# Patient Record
Sex: Female | Born: 1970 | Race: Black or African American | Hispanic: No | Marital: Single | Smoking: Former smoker
Health system: Southern US, Community
[De-identification: ages and names within clinical notes are randomized; demographics above are authoritative.]

## PROBLEM LIST (undated history)

## (undated) DIAGNOSIS — E049 Nontoxic goiter, unspecified: Secondary | ICD-10-CM

## (undated) DIAGNOSIS — Z87448 Personal history of other diseases of urinary system: Secondary | ICD-10-CM

## (undated) DIAGNOSIS — E0590 Thyrotoxicosis, unspecified without thyrotoxic crisis or storm: Secondary | ICD-10-CM

## (undated) DIAGNOSIS — Z789 Other specified health status: Secondary | ICD-10-CM

## (undated) DIAGNOSIS — I10 Essential (primary) hypertension: Secondary | ICD-10-CM

## (undated) DIAGNOSIS — T7840XA Allergy, unspecified, initial encounter: Secondary | ICD-10-CM

## (undated) DIAGNOSIS — M1990 Unspecified osteoarthritis, unspecified site: Secondary | ICD-10-CM

## (undated) DIAGNOSIS — R222 Localized swelling, mass and lump, trunk: Secondary | ICD-10-CM

## (undated) DIAGNOSIS — Z8489 Family history of other specified conditions: Secondary | ICD-10-CM

## (undated) DIAGNOSIS — Z87898 Personal history of other specified conditions: Secondary | ICD-10-CM

## (undated) DIAGNOSIS — R042 Hemoptysis: Secondary | ICD-10-CM

## (undated) HISTORY — PX: WISDOM TOOTH EXTRACTION: SHX21

## (undated) HISTORY — DX: Allergy, unspecified, initial encounter: T78.40XA

## (undated) HISTORY — DX: Unspecified osteoarthritis, unspecified site: M19.90

## (undated) HISTORY — PX: TUBAL LIGATION: SHX77

---

## 1998-09-15 ENCOUNTER — Emergency Department (HOSPITAL_COMMUNITY): Admission: EM | Admit: 1998-09-15 | Discharge: 1998-09-15 | Payer: Self-pay | Admitting: Emergency Medicine

## 1999-02-27 ENCOUNTER — Emergency Department (HOSPITAL_COMMUNITY): Admission: EM | Admit: 1999-02-27 | Discharge: 1999-02-27 | Payer: Self-pay | Admitting: Internal Medicine

## 1999-08-17 ENCOUNTER — Encounter: Payer: Self-pay | Admitting: Pediatrics

## 1999-08-17 ENCOUNTER — Ambulatory Visit: Admission: RE | Admit: 1999-08-17 | Discharge: 1999-08-17 | Payer: Self-pay | Admitting: Internal Medicine

## 1999-10-26 ENCOUNTER — Other Ambulatory Visit: Admission: RE | Admit: 1999-10-26 | Discharge: 1999-10-26 | Payer: Self-pay | Admitting: Family Medicine

## 2000-12-21 ENCOUNTER — Emergency Department (HOSPITAL_COMMUNITY): Admission: EM | Admit: 2000-12-21 | Discharge: 2000-12-21 | Payer: Self-pay | Admitting: Emergency Medicine

## 2004-05-08 ENCOUNTER — Emergency Department (HOSPITAL_COMMUNITY): Admission: EM | Admit: 2004-05-08 | Discharge: 2004-05-08 | Payer: Self-pay | Admitting: Emergency Medicine

## 2005-08-07 ENCOUNTER — Emergency Department (HOSPITAL_COMMUNITY): Admission: EM | Admit: 2005-08-07 | Discharge: 2005-08-07 | Payer: Self-pay | Admitting: Emergency Medicine

## 2005-08-16 ENCOUNTER — Emergency Department (HOSPITAL_COMMUNITY): Admission: EM | Admit: 2005-08-16 | Discharge: 2005-08-16 | Payer: Self-pay | Admitting: Emergency Medicine

## 2006-11-01 ENCOUNTER — Emergency Department (HOSPITAL_COMMUNITY): Admission: EM | Admit: 2006-11-01 | Discharge: 2006-11-01 | Payer: Self-pay | Admitting: Emergency Medicine

## 2007-07-27 ENCOUNTER — Emergency Department (HOSPITAL_COMMUNITY): Admission: EM | Admit: 2007-07-27 | Discharge: 2007-07-27 | Payer: Self-pay | Admitting: Emergency Medicine

## 2008-06-04 ENCOUNTER — Emergency Department (HOSPITAL_COMMUNITY): Admission: EM | Admit: 2008-06-04 | Discharge: 2008-06-05 | Payer: Self-pay | Admitting: Emergency Medicine

## 2009-12-11 ENCOUNTER — Emergency Department (HOSPITAL_COMMUNITY): Admission: EM | Admit: 2009-12-11 | Discharge: 2009-12-11 | Payer: Self-pay | Admitting: Emergency Medicine

## 2010-04-20 LAB — WET PREP, GENITAL: Yeast Wet Prep HPF POC: NONE SEEN

## 2010-04-20 LAB — URINALYSIS, ROUTINE W REFLEX MICROSCOPIC
Bilirubin Urine: NEGATIVE
Glucose, UA: NEGATIVE mg/dL
Hgb urine dipstick: NEGATIVE
Ketones, ur: NEGATIVE mg/dL
Nitrite: NEGATIVE
Protein, ur: NEGATIVE mg/dL
Specific Gravity, Urine: 1.02 (ref 1.005–1.030)
Urobilinogen, UA: 4 mg/dL — ABNORMAL HIGH (ref 0.0–1.0)
pH: 7 (ref 5.0–8.0)

## 2010-04-20 LAB — POCT PREGNANCY, URINE: Preg Test, Ur: NEGATIVE

## 2010-04-20 LAB — URINE MICROSCOPIC-ADD ON

## 2010-11-18 LAB — BASIC METABOLIC PANEL
BUN: 12
CO2: 29
Chloride: 102
Creatinine, Ser: 0.61
Glucose, Bld: 109 — ABNORMAL HIGH
Potassium: 4.2

## 2010-11-18 LAB — URINALYSIS, ROUTINE W REFLEX MICROSCOPIC
Bilirubin Urine: NEGATIVE
Ketones, ur: NEGATIVE
Nitrite: NEGATIVE
Urobilinogen, UA: 0.2
pH: 6

## 2010-11-18 LAB — PREGNANCY, URINE: Preg Test, Ur: NEGATIVE

## 2011-09-19 ENCOUNTER — Emergency Department (HOSPITAL_COMMUNITY)
Admission: EM | Admit: 2011-09-19 | Discharge: 2011-09-19 | Disposition: A | Discharge status: Home / Self Care | Payer: Self-pay | Attending: Emergency Medicine | Admitting: Emergency Medicine

## 2011-09-19 ENCOUNTER — Encounter (HOSPITAL_COMMUNITY): Payer: Self-pay

## 2011-09-19 ENCOUNTER — Emergency Department (HOSPITAL_COMMUNITY): Payer: Self-pay

## 2011-09-19 DIAGNOSIS — R05 Cough: Secondary | ICD-10-CM | POA: Insufficient documentation

## 2011-09-19 DIAGNOSIS — Z87891 Personal history of nicotine dependence: Secondary | ICD-10-CM | POA: Insufficient documentation

## 2011-09-19 DIAGNOSIS — J209 Acute bronchitis, unspecified: Secondary | ICD-10-CM

## 2011-09-19 DIAGNOSIS — R059 Cough, unspecified: Secondary | ICD-10-CM | POA: Insufficient documentation

## 2011-09-19 DIAGNOSIS — R109 Unspecified abdominal pain: Secondary | ICD-10-CM | POA: Insufficient documentation

## 2011-09-19 MED ORDER — ALBUTEROL SULFATE HFA 108 (90 BASE) MCG/ACT IN AERS
2.0000 | INHALATION_SPRAY | Freq: Once | RESPIRATORY_TRACT | Status: AC
  Administered 2011-09-19: 2 via RESPIRATORY_TRACT
  Filled 2011-09-19: qty 6.7

## 2011-09-19 MED ORDER — AZITHROMYCIN 250 MG PO TABS: 250.0000 mg | ORAL_TABLET | Freq: Every day | ORAL | Status: DC

## 2011-09-19 MED ORDER — ALBUTEROL SULFATE (5 MG/ML) 0.5% IN NEBU
5.0000 mg | INHALATION_SOLUTION | Freq: Once | RESPIRATORY_TRACT | Status: AC
  Administered 2011-09-19: 5 mg via RESPIRATORY_TRACT
  Filled 2011-09-19: qty 1

## 2011-09-19 MED ORDER — PREDNISONE 10 MG PO TABS: 20.0000 mg | ORAL_TABLET | Freq: Two times a day (BID) | ORAL | Status: DC

## 2011-09-19 NOTE — ED Notes (Signed)
Pt sts she got off work, and started to cough and had side pain, sts hemoptysis.

## 2011-09-19 NOTE — ED Notes (Signed)
Awaiting completion of nebulizer treatment before discharging patient. 

## 2011-09-19 NOTE — ED Notes (Signed)
The patient is AOx4 and comfortable with her discharge instructions. 

## 2011-09-19 NOTE — ED Provider Notes (Signed)
History     CSN: [id number]  Arrival date & time 09/19/11  1638   First MD Initiated Contact with Patient 09/19/11 2056      Chief Complaint  Patient presents with  . Cough  . Flank Pain    (Consider location/radiation/quality/duration/timing/severity/associated sxs/prior treatment) HPI Comments: Patient is a 2-pack/day smoker who recently quit 3 months ago.  She reports to me she has had a cough for the past 5 years.  The past two days her cough has worsened and is productive of a bloody sputum.  She denies fevers or chills.  She denies chest pain.    Patient is a 41 y.o. female presenting with cough. The history is provided by the patient.  Cough This is a new problem. The current episode started 2 days ago. The problem has been gradually worsening. The cough is productive of bloody sputum. There has been no fever. Pertinent negatives include no chills and no wheezing. She has tried nothing for the symptoms. Her past medical history does not include pneumonia or emphysema.    History reviewed. No pertinent past medical history.  No past surgical history on file.  No family history on file.  History  Substance Use Topics  . Smoking status: Former Games developer  . Smokeless tobacco: Not on file  . Alcohol Use: No    OB History    Grav Para Term Preterm Abortions TAB SAB Ect Mult Living                  Review of Systems  Constitutional: Negative for chills.  Respiratory: Positive for cough. Negative for wheezing.   All other systems reviewed and are negative.    Allergies  Review of patient's allergies indicates no known allergies.  Home Medications   Current Outpatient Rx  Name Route Sig Dispense Refill  . IBUPROFEN 200 MG PO TABS Oral Take 200 mg by mouth every 6 (six) hours as needed. For pain      BP 117/68  Pulse 82  Temp 98.4 F (36.9 C) (Oral)  Resp 16  SpO2 100%  LMP 09/17/2011  Physical Exam  Nursing note and vitals reviewed. Constitutional: She  is oriented to person, place, and time. She appears well-developed and well-nourished. No distress.  HENT:  Head: Normocephalic and atraumatic.  Mouth/Throat: Oropharynx is clear and moist.  Neck: Normal range of motion. Neck supple.  Cardiovascular: Normal rate and regular rhythm.   No murmur heard. Pulmonary/Chest: Effort normal and breath sounds normal. No respiratory distress. She has no wheezes.  Abdominal: Soft. Bowel sounds are normal. She exhibits no distension. There is no tenderness.  Musculoskeletal: Normal range of motion. She exhibits no edema.  Neurological: She is alert and oriented to person, place, and time.  Skin: Skin is warm and dry. She is not diaphoretic.    ED Course  Procedures (including critical care time)  Labs Reviewed - No data to display Dg Chest 2 View  09/19/2011  *RADIOLOGY REPORT*  Clinical Data: Cough.  Spitting up blood.  CHEST - 2 VIEW  Comparison: PA and lateral chest 11/01/2006.  Findings: The lungs are clear.  Heart size is normal.  No pneumothorax or effusion.  No focal bony abnormality.  IMPRESSION: Negative chest.  Original Report Authenticated By: Bernadene Bell. Angel Kemp, M.D.     No diagnosis found.    MDM  Appears to be bronchitis.  Will treat with zmax, mdi, prednisone.  To return prn if worsens.  Geoffery Lyons, MD 09/19/11 2112

## 2011-09-21 ENCOUNTER — Emergency Department (HOSPITAL_COMMUNITY): Payer: Self-pay

## 2011-09-21 ENCOUNTER — Inpatient Hospital Stay (HOSPITAL_COMMUNITY)
Admission: EM | Admit: 2011-09-21 | Discharge: 2011-09-23 | DRG: 204 | Disposition: A | Discharge status: Home / Self Care | Payer: Self-pay | Attending: Internal Medicine | Admitting: Internal Medicine

## 2011-09-21 ENCOUNTER — Encounter (HOSPITAL_COMMUNITY): Payer: Self-pay | Admitting: *Deleted

## 2011-09-21 DIAGNOSIS — R222 Localized swelling, mass and lump, trunk: Secondary | ICD-10-CM

## 2011-09-21 DIAGNOSIS — R042 Hemoptysis: Principal | ICD-10-CM | POA: Diagnosis present

## 2011-09-21 DIAGNOSIS — E041 Nontoxic single thyroid nodule: Secondary | ICD-10-CM | POA: Diagnosis present

## 2011-09-21 DIAGNOSIS — I498 Other specified cardiac arrhythmias: Secondary | ICD-10-CM | POA: Diagnosis present

## 2011-09-21 DIAGNOSIS — E0590 Thyrotoxicosis, unspecified without thyrotoxic crisis or storm: Secondary | ICD-10-CM | POA: Diagnosis present

## 2011-09-21 DIAGNOSIS — Z87891 Personal history of nicotine dependence: Secondary | ICD-10-CM

## 2011-09-21 DIAGNOSIS — J9859 Other diseases of mediastinum, not elsewhere classified: Secondary | ICD-10-CM | POA: Diagnosis present

## 2011-09-21 HISTORY — DX: Localized swelling, mass and lump, trunk: R22.2

## 2011-09-21 HISTORY — DX: Other specified health status: Z78.9

## 2011-09-21 HISTORY — DX: Family history of other specified conditions: Z84.89

## 2011-09-21 HISTORY — DX: Hemoptysis: R04.2

## 2011-09-21 LAB — CBC WITH DIFFERENTIAL/PLATELET
Basophils Absolute: 0 10*3/uL (ref 0.0–0.1)
Eosinophils Absolute: 0.2 10*3/uL (ref 0.0–0.7)
Eosinophils Relative: 2 % (ref 0–5)
HCT: 41 % (ref 36.0–46.0)
Hemoglobin: 14.2 g/dL (ref 12.0–15.0)
Lymphocytes Relative: 38 % (ref 12–46)
Lymphs Abs: 4.5 10*3/uL — ABNORMAL HIGH (ref 0.7–4.0)
MCH: 30.1 pg (ref 26.0–34.0)
MCH: 30.1 pg (ref 26.0–34.0)
MCHC: 34.6 g/dL (ref 30.0–36.0)
MCV: 86.9 fL (ref 78.0–100.0)
Neutrophils Relative %: 53 % (ref 43–77)
Platelets: 260 10*3/uL (ref 150–400)
RBC: 4.72 MIL/uL (ref 3.87–5.11)
RBC: 4.35 MIL/uL (ref 3.87–5.11)
RDW: 12.5 % (ref 11.5–15.5)
WBC: 11.9 10*3/uL — ABNORMAL HIGH (ref 4.0–10.5)

## 2011-09-21 LAB — URINALYSIS, ROUTINE W REFLEX MICROSCOPIC
Bilirubin Urine: NEGATIVE
Glucose, UA: NEGATIVE mg/dL
Ketones, ur: NEGATIVE mg/dL
Leukocytes, UA: NEGATIVE
Protein, ur: NEGATIVE mg/dL
pH: 6 (ref 5.0–8.0)

## 2011-09-21 LAB — COMPREHENSIVE METABOLIC PANEL
ALT: 21 U/L (ref 0–35)
ALT: 19 U/L (ref 0–35)
AST: 15 U/L (ref 0–37)
Albumin: 3.9 g/dL (ref 3.5–5.2)
Alkaline Phosphatase: 118 U/L — ABNORMAL HIGH (ref 39–117)
BUN: 14 mg/dL (ref 6–23)
CO2: 24 mEq/L (ref 19–32)
Calcium: 10.5 mg/dL (ref 8.4–10.5)
Calcium: 10.3 mg/dL (ref 8.4–10.5)
GFR calc Af Amer: >90 mL/min (ref >90)
GFR calc Af Amer: >90 mL/min (ref >90)
GFR calc non Af Amer: >90 mL/min (ref >90)
Glucose, Bld: 136 mg/dL — ABNORMAL HIGH (ref 70–99)
Glucose, Bld: 130 mg/dL — ABNORMAL HIGH (ref 70–99)
Potassium: 3.6 mEq/L (ref 3.5–5.1)
Sodium: 140 mEq/L (ref 135–145)
Sodium: 142 mEq/L (ref 135–145)
Total Protein: 7.3 g/dL (ref 6.0–8.3)

## 2011-09-21 LAB — T4, FREE: Free T4: 6.7 ng/dL — ABNORMAL HIGH (ref 0.80–1.80)

## 2011-09-21 LAB — URINE MICROSCOPIC-ADD ON

## 2011-09-21 LAB — T3, FREE: T3, Free: 17.1 pg/mL — ABNORMAL HIGH (ref 2.3–4.2)

## 2011-09-21 LAB — HIV ANTIBODY (ROUTINE TESTING W REFLEX): HIV: NONREACTIVE

## 2011-09-21 MED ORDER — ONDANSETRON HCL 4 MG/2ML IJ SOLN: 4.0000 mg | Freq: Four times a day (QID) | INTRAMUSCULAR | Status: DC | PRN

## 2011-09-21 MED ORDER — ACETAMINOPHEN 325 MG PO TABS
650.0000 mg | ORAL_TABLET | Freq: Four times a day (QID) | ORAL | Status: DC | PRN
  Administered 2011-09-21: 650 mg via ORAL
  Filled 2011-09-21: qty 2

## 2011-09-21 MED ORDER — IOHEXOL 350 MG/ML SOLN
80.0000 mL | Freq: Once | INTRAVENOUS | Status: AC | PRN
  Administered 2011-09-21: 80 mL via INTRAVENOUS

## 2011-09-21 MED ORDER — ONDANSETRON HCL 4 MG PO TABS: 4.0000 mg | ORAL_TABLET | Freq: Four times a day (QID) | ORAL | Status: DC | PRN

## 2011-09-21 MED ORDER — SODIUM CHLORIDE 0.9 % IV SOLN
INTRAVENOUS | Status: DC
  Administered 2011-09-21 (×2): via INTRAVENOUS

## 2011-09-21 MED ORDER — ACETAMINOPHEN 650 MG RE SUPP: 650.0000 mg | Freq: Four times a day (QID) | RECTAL | Status: DC | PRN

## 2011-09-21 NOTE — ED Provider Notes (Addendum)
History     CSN: [id number]  Arrival date & time 09/21/11  0015   First MD Initiated Contact with Patient 09/21/11 0140      Chief Complaint  Patient presents with  . multiple symptoms     (Consider location/radiation/quality/duration/timing/severity/associated sxs/prior treatment) Patient is a 41 y.o. female presenting with cough. The history is provided by the patient.  Cough This is a recurrent problem. The current episode started 2 days ago. The problem has not changed since onset.The cough is productive of blood-tinged sputum. There has been no fever. The fever has been present for 1 to 2 days. Associated symptoms include sore throat. Pertinent negatives include no chest pain. The treatment provided no relief. Her past medical history is significant for bronchitis. Her past medical history does not include pneumonia.    History reviewed. No pertinent past medical history.  History reviewed. No pertinent past surgical history.  No family history on file.  History  Substance Use Topics  . Smoking status: Former Games developer  . Smokeless tobacco: Not on file  . Alcohol Use: No    OB History    Grav Para Term Preterm Abortions TAB SAB Ect Mult Living                  Review of Systems  HENT: Positive for sore throat.   Respiratory: Positive for cough.   Cardiovascular: Negative for chest pain.  All other systems reviewed and are negative.    Allergies  Review of patient's allergies indicates no known allergies.  Home Medications   Current Outpatient Rx  Name Route Sig Dispense Refill  . ALBUTEROL SULFATE HFA 108 (90 BASE) MCG/ACT IN AERS Inhalation Inhale 2 puffs into the lungs every 6 (six) hours as needed. wheezing    . AZITHROMYCIN 250 MG PO TABS Oral Take 1 tablet (250 mg total) by mouth daily. Take first 2 tablets together, then 1 every day until finished. 6 tablet 0  . IBUPROFEN 200 MG PO TABS Oral Take 200 mg by mouth every 6 (six) hours as needed. For pain     . PREDNISONE 10 MG PO TABS Oral Take 2 tablets (20 mg total) by mouth 2 (two) times daily. 20 tablet 0    BP 126/74  Pulse 91  Temp 97.7 F (36.5 C) (Oral)  Resp 17  Ht 5\' 3"  (1.6 m)  Wt 136 lb (61.689 kg)  BMI 24.09 kg/m2  SpO2 98%  LMP 09/17/2011  Physical Exam  Constitutional: She is oriented to person, place, and time. She appears well-developed and well-nourished.  HENT:  Head: Normocephalic and atraumatic.  Eyes: Conjunctivae and EOM are normal. Pupils are equal, round, and reactive to light.  Neck: Normal range of motion.  Cardiovascular: Normal rate, regular rhythm and normal heart sounds.   Pulmonary/Chest: Effort normal and breath sounds normal.  Abdominal: Soft. Bowel sounds are normal.  Musculoskeletal: Normal range of motion.  Neurological: She is alert and oriented to person, place, and time.  Skin: Skin is warm and dry.  Psychiatric: She has a normal mood and affect. Her behavior is normal.    ED Course  Procedures (including critical care time)  Labs Reviewed  URINALYSIS, ROUTINE W REFLEX MICROSCOPIC - Abnormal; Notable for the following:    Hgb urine dipstick TRACE (*)     All other components within normal limits  COMPREHENSIVE METABOLIC PANEL - Abnormal; Notable for the following:    Glucose, Bld 136 (*)     Creatinine, Ser  0.39 (*)     Alkaline Phosphatase 135 (*)     All other components within normal limits  URINE MICROSCOPIC-ADD ON - Abnormal; Notable for the following:    Casts GRANULAR CAST (*)     All other components within normal limits  PREGNANCY, URINE  CBC WITH DIFFERENTIAL   Dg Chest 2 View  09/19/2011  *RADIOLOGY REPORT*  Clinical Data: Cough.  Spitting up blood.  CHEST - 2 VIEW  Comparison: PA and lateral chest 11/01/2006.  Findings: The lungs are clear.  Heart size is normal.  No pneumothorax or effusion.  No focal bony abnormality.  IMPRESSION: Negative chest.  Original Report Authenticated By: Bernadene Bell. Maricela Curet, M.D.     No  diagnosis found.    MDM  + recurrent mild hemoptysis.  Streaks of blood.  Minor nosebleed.  Seen yesterday,  No improvement per pt.  Will ct chest to r/o pe, mass, and reassess   + chest mass,  Will admit for definitive managment      Monic Engelmann Lytle Michaels, MD 09/21/11 0251  Ryett Hamman Lytle Michaels, MD 09/21/11 [phone]

## 2011-09-21 NOTE — H&P (Signed)
Angel Kemp is an 41 y.o. female.   Patient was seen and examined on September 21, 2011 at 6:25 AM. PCP - none. Chief Complaint: Coughing up blood. HPI: 41 year-old female with no significant past medical history has been coughing up blood last 2 days. She never had this symptom before. She had come to the ER on the first day and was prescribed antibiotics and discharged and x-rays done at that time did not show anything acute. Since her hemoptysis persisted patient return back to the ER. Patient states she coughs up frank blood and not tinged sputum. Denies any fever chills but over last few months has been having night sweats. Patient does have a left axillary swelling which has been there for many years. Denies any foreign travel or contact with anybody with tuberculosis. Patient denies chest pain shortness of breath nausea vomiting abdominal pain. Patient will be admitted for further management.  Past Medical History  Diagnosis Date  . No pertinent past medical history     History reviewed. No pertinent past surgical history.  Family History  Problem Relation Age of Onset  . Hypertension Mother   . Diabetes type II Mother    Social History:  reports that she has quit smoking. She does not have any smokeless tobacco history on file. She reports that she does not drink alcohol or use illicit drugs.  Allergies: No Known Allergies   (Not in a hospital admission)  Results for orders placed during the hospital encounter of 09/21/11 (from the past 48 hour(s))  URINALYSIS, ROUTINE W REFLEX MICROSCOPIC     Status: Abnormal   Collection Time   09/21/11 12:45 AM      Component Value Range Comment   Color, Urine YELLOW  YELLOW    APPearance CLEAR  CLEAR    Specific Gravity, Urine 1.021  1.005 - 1.030    pH 6.0  5.0 - 8.0    Glucose, UA NEGATIVE  NEGATIVE mg/dL    Hgb urine dipstick TRACE (*) NEGATIVE    Bilirubin Urine NEGATIVE  NEGATIVE    Ketones, ur NEGATIVE  NEGATIVE mg/dL    Protein, ur NEGATIVE  NEGATIVE mg/dL    Urobilinogen, UA 0.2  0.0 - 1.0 mg/dL    Nitrite NEGATIVE  NEGATIVE    Leukocytes, UA NEGATIVE  NEGATIVE   PREGNANCY, URINE     Status: Normal   Collection Time   09/21/11 12:45 AM      Component Value Range Comment   Preg Test, Ur NEGATIVE  NEGATIVE   URINE MICROSCOPIC-ADD ON     Status: Abnormal   Collection Time   09/21/11 12:45 AM      Component Value Range Comment   Squamous Epithelial / LPF RARE  RARE    RBC / HPF 0-2  <3 RBC/hpf    Bacteria, UA RARE  RARE    Casts GRANULAR CAST (*) NEGATIVE   CBC WITH DIFFERENTIAL     Status: Normal   Collection Time   09/21/11  1:03 AM      Component Value Range Comment   WBC 9.6  4.0 - 10.5 K/uL    RBC 4.72  3.87 - 5.11 MIL/uL    Hemoglobin 14.2  12.0 - 15.0 g/dL    HCT 40.9  81.1 - 91.4 %    MCV 86.9  78.0 - 100.0 fL    MCH 30.1  26.0 - 34.0 pg    MCHC 34.6  30.0 - 36.0 g/dL  RDW 12.6  11.5 - 15.5 %    Platelets 317  150 - 400 K/uL   COMPREHENSIVE METABOLIC PANEL     Status: Abnormal   Collection Time   09/21/11  1:03 AM      Component Value Range Comment   Sodium 140  135 - 145 mEq/L    Potassium 4.6  3.5 - 5.1 mEq/L    Chloride 104  96 - 112 mEq/L    CO2 24  19 - 32 mEq/L    Glucose, Bld 136 (*) 70 - 99 mg/dL    BUN 14  6 - 23 mg/dL    Creatinine, Ser 0.98 (*) 0.50 - 1.10 mg/dL    Calcium 11.9  8.4 - 10.5 mg/dL    Total Protein 8.3  6.0 - 8.3 g/dL    Albumin 4.2  3.5 - 5.2 g/dL    AST 25  0 - 37 U/L    ALT 21  0 - 35 U/L    Alkaline Phosphatase 135 (*) 39 - 117 U/L    Total Bilirubin 0.4  0.3 - 1.2 mg/dL    GFR calc non Af Amer >90  >90 mL/min    GFR calc Af Amer >90  >90 mL/min    Dg Chest 2 View  09/19/2011  *RADIOLOGY REPORT*  Clinical Data: Cough.  Spitting up blood.  CHEST - 2 VIEW  Comparison: PA and lateral chest 11/01/2006.  Findings: The lungs are clear.  Heart size is normal.  No pneumothorax or effusion.  No focal bony abnormality.  IMPRESSION: Negative chest.  Original  Report Authenticated By: Bernadene Bell. Maricela Curet, M.D.   Ct Angio Chest W/cm &/or Wo Cm  09/21/2011  *RADIOLOGY REPORT*  Clinical Data: Hemoptysis.  Sore throat.  Blood change sputum. Coughing and bronchitis.  CT ANGIOGRAPHY CHEST  Technique:  Multidetector CT imaging of the chest using the standard protocol during bolus administration of intravenous contrast. Multiplanar reconstructed images including MIPs were obtained and reviewed to evaluate the vascular anatomy.  Contrast: 80mL OMNIPAQUE IOHEXOL 350 MG/ML SOLN  Comparison: Chest radiograph 09/19/2011.  Findings: Thyroid goiter is present.  13 mm nonspecific low density thyroid nodule is present in the medial right thyroid lobe.  There is no axillary adenopathy.  There is no hilar or discrete mediastinal adenopathy.  Anterior mediastinal abnormal soft tissue is present, which follows a configuration most compatible with thymic tissue however this should have undergone involution by this time.  The study is technically adequate for evaluation of pulmonary embolism.  No pulmonary embolus is present.  Normal three-vessel aortic arch. No airspace disease.  Incidental imaging the upper abdomen demonstrates a low-density 13 mm left hepatic lobe lesion, statistically likely a cyst.  There is no effusion. Small nonspecific subcutaneous lesion is present in the lateral aspect of the left axilla measuring 16 mm; this probably represents a sebaceous cyst.  IMPRESSION: 1.  Anterior mediastinal mass.  Thymic tissue will typically have undergone involution at this time.  The differential considerations include thymic hyperplasia, thymoma, or lymphoma.  This does not appear to be contiguous with the thyroid lobe and demonstrates lower attenuation than thyroid goiter which is also present. 2.  Thyroid goiter with 13 mm right thyroid lobe nodule. Consider further evaluation with thyroid ultrasound.  If patient is clinically hyperthyroid, consider nuclear medicine thyroid uptake  and scan. 3.  Negative for pulmonary embolus.  No acute cardiopulmonary disease.  Original Report Authenticated By: Andreas Newport, M.D.    Review of  Systems  Constitutional:       Night sweats.  HENT: Negative.   Eyes: Negative.   Respiratory: Positive for hemoptysis.   Cardiovascular: Negative.   Gastrointestinal: Negative.   Genitourinary: Negative.   Musculoskeletal: Negative.   Skin: Negative.   Neurological: Negative.   Endo/Heme/Allergies: Negative.   Psychiatric/Behavioral: Negative.     Blood pressure 125/68, pulse 101, temperature 97.9 F (36.6 C), temperature source Oral, resp. rate 16, height 5\' 3"  (1.6 m), weight 61.689 kg (136 lb), last menstrual period 09/17/2011, SpO2 97.00%. Physical Exam  Constitutional: She is oriented to person, place, and time. She appears well-developed and well-nourished. No distress.  HENT:  Head: Normocephalic and atraumatic.  Right Ear: External ear normal.  Left Ear: External ear normal.  Nose: Nose normal.  Mouth/Throat: Oropharynx is clear and moist. No oropharyngeal exudate.       Thyroid swelling.  Eyes: Conjunctivae are normal. Pupils are equal, round, and reactive to light. Right eye exhibits no discharge. Left eye exhibits no discharge. No scleral icterus.  Neck: Normal range of motion. Neck supple.  Cardiovascular:       Mild sinus tachycardia.  Respiratory: Effort normal and breath sounds normal.  GI: Soft. Bowel sounds are normal. She exhibits no distension. There is no tenderness. There is no rebound.  Musculoskeletal: Normal range of motion. She exhibits no edema and no tenderness.       Left axillary node.  Neurological: She is alert and oriented to person, place, and time.       Moves all extremities.  Skin: Skin is warm and dry. She is not diaphoretic.  Psychiatric: Her behavior is normal.     Assessment/Plan #1. Hemoptysis with anterior mediastinal mass and neck swelling - I did discuss with radiologist the  primary concern is for lymphoma. Consult cardiothoracic surgeon in a.m. with regarding to her mediastinal mass and further workup. Check LDH, HIV. Patient also has swelling of the thyroid area. We will check thyroid function tests. But as per radiologist the swelling in the neck area is not probably thyroid. Patient lung fields are clear as per the radiologist and patient has no features to suggest TB. #2. Tobacco abuse - quit 3 months ago.   CODE STATUS - full code.  Eduard Clos 09/21/2011, 6:52 AM

## 2011-09-21 NOTE — ED Notes (Signed)
She has multiple symptoms abd pain  nv headache vnv with diarrhea.   She was seen here in the ed last pm for the same symptoms.  lmp sat

## 2011-09-21 NOTE — Consult Note (Signed)
Name: Angel Kemp MRN: [id number] DOB: 1970-10-20    LOS: 0  [facility] Note   History of Present Illness:  41 y/o F with no known PMH admitted to [facility] on 8/14 per [facility] with a 2 day history of coughing up blood.  She was seen in ER on 8/12 for the same and given antibiotics.  CXR at that time did not show any acute changes.  Hemoptysis persisted and she began coughing up frank blood.  Reports left axillary swelling for several years.  Denies fevers, chills, shortness of breath, n/v, abdominal pain, chest pain.  Indicates night sweats.     Cultures:   Antibiotics:   Tests / Events: 8/14 CTA Chest>>>Anterior mediastinal mass. Thymic tissue will typically have undergone involution at this time. The differential considerations include thymic hyperplasia, thymoma, or lymphoma. This does not appear to be contiguous with the thyroid lobe and demonstrates lower attenuation than thyroid goiter which is also present.  Thyroid goiter with 13 mm right thyroid lobe nodule. Consider further evaluation with thyroid ultrasound. If patient is clinically hyperthyroid, consider nuclear medicine thyroid uptake and scan.  Negative for pulmonary embolus. No acute cardiopulmonary disease.    Past Medical History  Diagnosis Date  . No pertinent past medical history     History reviewed. No pertinent past surgical history.  Prior to Admission medications   Medication Sig Start Date End Date Taking? Authorizing Provider  albuterol (PROVENTIL HFA;VENTOLIN HFA) 108 (90 BASE) MCG/ACT inhaler Inhale 2 puffs into the lungs every 6 (six) hours as needed. wheezing   Yes Historical Provider, MD  azithromycin (ZITHROMAX) 250 MG tablet Take 1 tablet (250 mg total) by mouth daily. Take first 2 tablets together, then 1 every day until finished. 09/19/11 09/24/11 Yes Geoffery Lyons, MD  ibuprofen (ADVIL,MOTRIN) 200 MG tablet Take 200 mg by mouth every 6 (six) hours as needed. For pain   Yes Historical  Provider, MD  predniSONE (DELTASONE) 10 MG tablet Take 2 tablets (20 mg total) by mouth 2 (two) times daily. 09/19/11  Yes Geoffery Lyons, MD    Allergies No Known Allergies  Family History Family History  Problem Relation Age of Onset  . Hypertension Mother   . Diabetes type II Mother     Social History  reports that she has quit smoking. She does not have any smokeless tobacco history on file. She reports that she does not drink alcohol or use illicit drugs.  Review Of Systems:  Gen: Denies fever, chills, weight change, fatigue.  Indicates night sweats HEENT: Denies blurred vision, double vision, hearing loss, tinnitus, sinus congestion, rhinorrhea, sore throat, neck stiffness, dysphagia PULM: Denies shortness of breath, cough, sputum production, wheezing.  Indicates hemoptysis.  CV: Denies chest pain, edema, orthopnea, paroxysmal nocturnal dyspnea, palpitations GI: Denies abdominal pain, nausea, vomiting, diarrhea, hematochezia, melena, constipation, change in bowel habits GU: Denies dysuria, hematuria, polyuria, oliguria, urethral discharge Endocrine: Denies hot or cold intolerance, polyuria, polyphagia or appetite change Derm: Denies rash, dry skin, scaling or peeling skin change Heme: Denies easy bruising, bleeding, bleeding gums Neuro: Denies headache, numbness, weakness, slurred speech, loss of memory or consciousness  Vital Signs: Temp:  [96.2 F (35.7 C)-98 F (36.7 C)] 98 F (36.7 C) (08/14 0804) Pulse Rate:  [91-105] 105  (08/14 0804) Resp:  [16-18] 18  (08/14 0804) BP: (118-126)/(68-80) 118/77 mmHg (08/14 0804) SpO2:  [97 %-99 %] 99 % (08/14 0804) Weight:  [136 lb (61.689 kg)] 136 lb (61.689 kg) (08/14  5784)    Physical Examination: General: Chronically ill appearing female. Neuro: Lethargic but arousable and following commands. CV: RRR, Nl S1/S2, -M/R/G. PULM: CTA bilaterally. ON:GEXB, NT, ND and +BS. Extremities: -edema and -tenderness.   Labs     CBC  Lab 09/21/11 0908 09/21/11 0103  HGB 13.1 14.2  HCT 37.6 41.0  WBC 11.9* 9.6  PLT 260 317     BMET  Lab 09/21/11 0908 09/21/11 0103  NA 142 140  K 3.6 4.6  CL 105 104  CO2 25 24  GLUCOSE 130* 136*  BUN 12 14  CREATININE 0.38* 0.39*  CALCIUM 10.3 10.5  MG -- --  PHOS -- --     Lab 09/21/11 0908  INR 1.01    No results found for this basename: PHART:5,PCO2:5,PCO2ART:5,PO2:5,PO2ART:5,HCO3:5,TCO2:5,O2SAT:5 in the last 168 hours   Radiology: 8/12 CXR>>>Negative chest.    Assessment and Plan: Principal Problem:  *Hemoptysis Active Problems:  Mediastinal mass   Hemoptysis  Assessment:  ? If related to mass Plan: -assess sputum  -monitor cbc for changes in h/h -home meds reviewed, no thinners -consider FOB if bleeding increases, drop in h/h   Mediastinal Mass Thyroid Goiter  Assessment: Mediastinal mass, thyroid goiter.  No LAN noted on CT.  Consider Thymoma, lymphoma Plan: -consider FNA for biopsy    Angel Brim, NP-C [facility] Pgr: [phone]  09/21/2011, 12:00 PM  Unlikely to be able to evaluate anything bronchoscopically here.  Recommend for CVTS to see patient, will need biopsy of mass and a bronchoscopy can be done intra-op to spare patient additional procedures.  PCCM will sign off, please call back if needed.  Patient seen and examined, agree with above note.  I dictated the care and orders written for this patient under my direction.  Angel Kemp, M.D. [phone]

## 2011-09-21 NOTE — Care Management Note (Unsigned)
    Page 1 of 1   09/21/2011     4:59:37 PM   CARE MANAGEMENT NOTE 09/21/2011  Patient:  LINETTA, REGNER   Account Number:  [id number]  Date Initiated:  09/21/2011  Documentation initiated by:  Letha Cape  Subjective/Objective Assessment:   dx hemoptysis  admit- lives alone.  pta independent.     Action/Plan:   Anticipated DC Date:  09/25/2011   Anticipated DC Plan:  HOME/SELF CARE      DC Planning Services  CM consult      Choice offered to / List presented to:             Status of service:  In process, will continue to follow Medicare Important Message given?   (If response is "NO", the following Medicare IM given date fields will be blank) Date Medicare IM given:   Date Additional Medicare IM given:    Discharge Disposition:    Per UR Regulation:  Reviewed for med. necessity/level of care/duration of stay  If discussed at Long Length of Stay Meetings, dates discussed:    Comments:  09/21/11 16:58 Letha Cape RN, BSN [phone] patient lives alone, pta independent.  Patient is eligible for med ast if needed.  Patient has transportation.  NCM will continue to follow for dc needs.

## 2011-09-21 NOTE — Progress Notes (Signed)
[facility] PROGRESS NOTE  Angel Kemp WUJ:[id number] DOB: 27-Jul-1970 DOA: 09/21/2011  Assessment/Plan: Principal Problem:  *Hemoptysis Active Problems:  Mediastinal mass  1. Hemoptysis - the patient has not had a recurrence in the hospital. It is unclear if hemoptysis is related to the mediastinal mass. No coagulopathy present. Plan for close monitoring in house and pulmonary consultation. 2. Anterior mediastinal mass - I have asked thoracic surgeon for opinion - to see if patient needs a biopsy.  3. Thyroid nodule - TSH pending. Depends on results of TSH if we will do a thyroid scan or ultrasound  Code Status: full Disposition Plan: home   Brief narrative: 41 year old woman admitted with hemoptysis found to have a mediastinal mass on CT scan  Consultants:  Pulmonary  Thoracic surgery  Procedures:  CT scan chest  Antibiotics:  None  HPI/Subjective: No further hemoptysis, has a dry cough  Objective: Filed Vitals:   09/21/11 0038 09/21/11 0229 09/21/11 0620 09/21/11 0804  BP: 120/80 126/74 125/68 118/77  Pulse: 94 91 101 105  Temp: 96.2 F (35.7 C) 97.7 F (36.5 C) 97.9 F (36.6 C) 98 F (36.7 C)  TempSrc: Oral Oral Oral Oral  Resp:  17 16 18   Height:  5\' 3"  (1.6 m)    Weight:  61.689 kg (136 lb)    SpO2: 97% 98% 97% 99%   No intake or output data in the 24 hours ending 09/21/11 1206 Filed Weights   09/21/11 0229  Weight: 61.689 kg (136 lb)    Exam:   General:  Alert oriented x3  Cardiovascular: Regular rate and rhythm  Respiratory: Clear to auscultation bilaterally  Abdomen: Soft nontender  Data Reviewed: Basic Metabolic Panel:  Lab 09/21/11 9562 09/21/11 0103  NA 142 140  K 3.6 4.6  CL 105 104  CO2 25 24  GLUCOSE 130* 136*  BUN 12 14  CREATININE 0.38* 0.39*  CALCIUM 10.3 10.5  MG -- --  PHOS -- --   Liver Function Tests:  Lab 09/21/11 0908 09/21/11 0103  AST 15 25  ALT 19 21  ALKPHOS 118* 135*  BILITOT 0.4 0.4    PROT 7.3 8.3  ALBUMIN 3.9 4.2   No results found for this basename: LIPASE:5,AMYLASE:5 in the last 168 hours No results found for this basename: AMMONIA:5 in the last 168 hours CBC:  Lab 09/21/11 0908 09/21/11 0103  WBC 11.9* 9.6  NEUTROABS 6.3 --  HGB 13.1 14.2  HCT 37.6 41.0  MCV 86.4 86.9  PLT 260 317   Cardiac Enzymes: No results found for this basename: CKTOTAL:5,CKMB:5,CKMBINDEX:5,TROPONINI:5 in the last 168 hours BNP (last 3 results) No results found for this basename: PROBNP:3 in the last 8760 hours CBG: No results found for this basename: GLUCAP:5 in the last 168 hours  No results found for this or any previous visit (from the past 240 hour(s)).   Studies: Dg Chest 2 View  09/19/2011  *RADIOLOGY REPORT*  Clinical Data: Cough.  Spitting up blood.  CHEST - 2 VIEW  Comparison: PA and lateral chest 11/01/2006.  Findings: The lungs are clear.  Heart size is normal.  No pneumothorax or effusion.  No focal bony abnormality.  IMPRESSION: Negative chest.  Original Report Authenticated By: Bernadene Bell. Maricela Curet, M.D.   Ct Angio Chest W/cm &/or Wo Cm  09/21/2011  *RADIOLOGY REPORT*  Clinical Data: Hemoptysis.  Sore throat.  Blood change sputum. Coughing and bronchitis.  CT ANGIOGRAPHY CHEST  Technique:  Multidetector CT imaging of the chest using  the standard protocol during bolus administration of intravenous contrast. Multiplanar reconstructed images including MIPs were obtained and reviewed to evaluate the vascular anatomy.  Contrast: 80mL OMNIPAQUE IOHEXOL 350 MG/ML SOLN  Comparison: Chest radiograph 09/19/2011.  Findings: Thyroid goiter is present.  13 mm nonspecific low density thyroid nodule is present in the medial right thyroid lobe.  There is no axillary adenopathy.  There is no hilar or discrete mediastinal adenopathy.  Anterior mediastinal abnormal soft tissue is present, which follows a configuration most compatible with thymic tissue however this should have undergone  involution by this time.  The study is technically adequate for evaluation of pulmonary embolism.  No pulmonary embolus is present.  Normal three-vessel aortic arch. No airspace disease.  Incidental imaging the upper abdomen demonstrates a low-density 13 mm left hepatic lobe lesion, statistically likely a cyst.  There is no effusion. Small nonspecific subcutaneous lesion is present in the lateral aspect of the left axilla measuring 16 mm; this probably represents a sebaceous cyst.  IMPRESSION: 1.  Anterior mediastinal mass.  Thymic tissue will typically have undergone involution at this time.  The differential considerations include thymic hyperplasia, thymoma, or lymphoma.  This does not appear to be contiguous with the thyroid lobe and demonstrates lower attenuation than thyroid goiter which is also present. 2.  Thyroid goiter with 13 mm right thyroid lobe nodule. Consider further evaluation with thyroid ultrasound.  If patient is clinically hyperthyroid, consider nuclear medicine thyroid uptake and scan. 3.  Negative for pulmonary embolus.  No acute cardiopulmonary disease.  Original Report Authenticated By: Andreas Newport, M.D.    Scheduled Meds:  Continuous Infusions:    . sodium chloride 50 mL/hr at 09/21/11 0858    Principal Problem:  *Hemoptysis Active Problems:  Mediastinal mass    Time spent: 20 minutes    Anija Brickner  [facility] Pager [phone]. If 7PM-7AM, please contact night-coverage at [email or web address], password [facility] 09/21/2011, 12:06 PM  LOS: 0 days

## 2011-09-21 NOTE — Consult Note (Signed)
[address]            [place] [address]          [phone]       Angel Kemp [facility] Medical Record #[id number] Date of Birth: 1970/07/01  Referring: Dr Lavera Guise Primary Care: none  Chief Complaint:    Chief Complaint  Patient presents with  . multiple symptoms  and hemaptysis    History of Present Illness:     41 year-old female with no significant past medical history has been coughing up blood last 2 days. Shewas prescribed antibiotics and discharged and x-rays done at that time did not show anything acute. Since her hemoptysis persisted patient return back to the ER.  Denies any fever chills but over last few months has been having night sweats. Patient does have a left axillary swelling which has been there for many years. Denies any foreign travel or contact with anybody with tuberculosis. Patient denies chest pain shortness of breath nausea vomiting abdominal pain.   Current Activity/ Functional Status: Patient is independent with mobility/ambulation, transfers, ADL's, IADL's.   Past Medical History  Diagnosis Date  . No pertinent past medical history   . Family history of anesthesia complication     mother  .    Marland Kitchen      Past Surgical History  Procedure Date  . Tubal ligation     History  Smoking status  . Former Smoker  . Quit date: 07/07/2011  Smokeless tobacco  . 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 0.9 %  sodium chloride infusion   Intravenous Continuous Eduard Clos, MD 50 mL/hr at 09/21/11 0858    . acetaminophen (TYLENOL) tablet 650 mg  650 mg Oral Q6H PRN Eduard Clos, MD   650 mg at 09/21/11 1933   Or  . acetaminophen (TYLENOL) suppository 650 mg  650 mg Rectal Q6H PRN Eduard Clos, MD      . iohexol (OMNIPAQUE) 350  MG/ML injection 80 mL  80 mL Intravenous Once PRN Medication Radiologist, MD   80 mL at 09/21/11 0423  . ondansetron (ZOFRAN) tablet 4 mg  4 mg Oral Q6H PRN Eduard Clos, MD       Or  . ondansetron [facility]) injection 4 mg  4 mg Intravenous Q6H PRN Eduard Clos, MD        Prescriptions prior to admission  Medication Sig Dispense Refill  . albuterol (PROVENTIL HFA;VENTOLIN HFA) 108 (90 BASE) MCG/ACT inhaler Inhale 2 puffs into the lungs every 6 (six) hours as needed. wheezing      . azithromycin (ZITHROMAX) 250 MG tablet Take 1 tablet (250 mg total) by mouth daily. Take first 2 tablets together, then 1 every day until finished.  6 tablet  0  . ibuprofen (ADVIL,MOTRIN) 200 MG tablet Take 200 mg by mouth every 6 (six) hours as needed. For pain      . predniSONE (DELTASONE) 10 MG tablet Take 2 tablets (20 mg total) by mouth 2 (two) times daily.  20 tablet  0    Family History  Problem Relation Age of Onset  . Hypertension Mother   . Diabetes type II  Mother      Review of Systems:     Cardiac Review of Systems: Y or N  Chest Pain [  n  ]  Resting SOB [n   ] Exertional SOB  [nn  ]  Orthopnea [ n ]   Pedal Edema [   ]    Palpitations [ nn ] Syncope  [ n ]   Presyncope [  n ]  General Review of Systems: [Y] = yes [  ]=no Constitional: recent weight change [ n ]; anorexia [  ]; fatigue [  ]; nausea [  ]; night sweats [  ]; fever [ n ]; or chills [  n];                                                                                                                                          Dental: poor dentition[ y ];   Eye : blurred vision [  ]; diplopia [   ]; vision changes [  ];  Amaurosis fugax[  ]; Resp: cough [  ];  wheezing[  ];  hemoptysis[y  ]; shortness of breath[  ]; paroxysmal nocturnal dyspnea[n  ]; dyspnea on exertion[  ]; or orthopnea[  ];  GI:  gallstones[  ], vomiting[  ];  dysphagia[  ]; melena[  ];  hematochezia [  ]; heartburn[  ];   Hx of  Colonoscopy[ n ]; GU:  kidney stones [  ]; hematuria[  ];   dysuria [  ];  nocturia[  ];  history of     obstruction [  ];             Skin: rash, swelling[  ];, hair loss[  ];  peripheral edema[  ];  or itching[  ]; Musculosketetal: myalgias[  ];  joint swelling[  ];  joint erythema[  ];  joint pain[  ];  back pain[  ];  Heme/Lymph: bruising[  ];  bleeding[  ];  anemia[  ];  Neuro: TIA[  ];  headaches[  ];  stroke[  ];  vertigo[  ];  seizures[  ];   paresthesias[  ];  difficulty walking[  ];  Psych:depression[  ]; anxiety[  ];  Endocrine: diabetes[  ];  thyroid dysfunction[  ];  Immunizations: Flu [ ? ]; Pneumococcal[ ? ];  Other:  Physical Exam: BP 116/77  Pulse 94  Temp 98.6 F (37 C) (Oral)  Resp 18  Ht 5\' 3"  (1.6 m)  Wt 136 lb (61.689 kg)  BMI 24.09 kg/m2  SpO2 94%  LMP 09/17/2011  General appearance: cooperative, distracted and no distress Neurologic: intact but distracted on exam and questioning Heart: regular rate and rhythm, S1, S2 normal, no murmur, click, rub or gallop and normal apical impulse Lungs: clear to auscultation bilaterally and normal percussion bilaterally Abdomen: soft, non-tender; bowel sounds normal; no masses,  no organomegaly Extremities:  extremities normal, atraumatic, no cyanosis or edema and Homans sign is negative, no sign of DVT Large palpable thyroid bilaterial Axillary adenopathy not palpable  Diagnostic Studies & Laboratory data:     Recent Radiology Findings:   Ct Angio Chest W/cm &/or Wo Cm  09/21/2011  *RADIOLOGY REPORT*  Clinical Data: Hemoptysis.  Sore throat.  Blood change sputum. Coughing and bronchitis.  CT ANGIOGRAPHY CHEST  Technique:  Multidetector CT imaging of the chest using the standard protocol during bolus administration of intravenous contrast. Multiplanar reconstructed images including MIPs were obtained and reviewed to evaluate the vascular anatomy.  Contrast: 80mL OMNIPAQUE IOHEXOL 350 MG/ML SOLN  Comparison: Chest radiograph 09/19/2011.   Findings: Thyroid goiter is present.  13 mm nonspecific low density thyroid nodule is present in the medial right thyroid lobe.  There is no axillary adenopathy.  There is no hilar or discrete mediastinal adenopathy.  Anterior mediastinal abnormal soft tissue is present, which follows a configuration most compatible with thymic tissue however this should have undergone involution by this time.  The study is technically adequate for evaluation of pulmonary embolism.  No pulmonary embolus is present.  Normal three-vessel aortic arch. No airspace disease.  Incidental imaging the upper abdomen demonstrates a low-density 13 mm left hepatic lobe lesion, statistically likely a cyst.  There is no effusion. Small nonspecific subcutaneous lesion is present in the lateral aspect of the left axilla measuring 16 mm; this probably represents a sebaceous cyst.  IMPRESSION: 1.  Anterior mediastinal mass.  Thymic tissue will typically have undergone involution at this time.  The differential considerations include thymic hyperplasia, thymoma, or lymphoma.  This does not appear to be contiguous with the thyroid lobe and demonstrates lower attenuation than thyroid goiter which is also present. 2.  Thyroid goiter with 13 mm right thyroid lobe nodule. Consider further evaluation with thyroid ultrasound.  If patient is clinically hyperthyroid, consider nuclear medicine thyroid uptake and scan. 3.  Negative for pulmonary embolus.  No acute cardiopulmonary disease.  Original Report Authenticated By: Andreas Newport, M.D.      Recent Lab Findings: Lab Results  Component Value Date   WBC 11.9* 09/21/2011   HGB 13.1 09/21/2011   HCT 37.6 09/21/2011   PLT 260 09/21/2011   GLUCOSE 130* 09/21/2011   ALT 19 09/21/2011   AST 15 09/21/2011   NA 142 09/21/2011   K 3.6 09/21/2011   CL 105 09/21/2011   CREATININE 0.38* 09/21/2011   BUN 12 09/21/2011   CO2 25 09/21/2011   TSH 0.008* 09/21/2011   INR 1.01 09/21/2011   Dg Chest 2 View  09/19/2011   *RADIOLOGY REPORT*  Clinical Data: Cough.  Spitting up blood.  CHEST - 2 VIEW  Comparison: PA and lateral chest 11/01/2006.  Findings: The lungs are clear.  Heart size is normal.  No pneumothorax or effusion.  No focal bony abnormality.  IMPRESSION: Negative chest.  Original Report Authenticated By: Bernadene Bell. Maricela Curet, M.D.   Ct Angio Chest W/cm &/or Wo Cm  09/21/2011  *RADIOLOGY REPORT*  Clinical Data: Hemoptysis.  Sore throat.  Blood change sputum. Coughing and bronchitis.  CT ANGIOGRAPHY CHEST  Technique:  Multidetector CT imaging of the chest using the standard protocol during bolus administration of intravenous contrast. Multiplanar reconstructed images including MIPs were obtained and reviewed to evaluate the vascular anatomy.  Contrast: 80mL OMNIPAQUE IOHEXOL 350 MG/ML SOLN  Comparison: Chest radiograph 09/19/2011.  Findings: Thyroid goiter is present.  13 mm nonspecific low density thyroid nodule is present  in the medial right thyroid lobe.  There is no axillary adenopathy.  There is no hilar or discrete mediastinal adenopathy.  Anterior mediastinal abnormal soft tissue is present, which follows a configuration most compatible with thymic tissue however this should have undergone involution by this time.  The study is technically adequate for evaluation of pulmonary embolism.  No pulmonary embolus is present.  Normal three-vessel aortic arch. No airspace disease.  Incidental imaging the upper abdomen demonstrates a low-density 13 mm left hepatic lobe lesion, statistically likely a cyst.  There is no effusion. Small nonspecific subcutaneous lesion is present in the lateral aspect of the left axilla measuring 16 mm; this probably represents a sebaceous cyst.  IMPRESSION: 1.  Anterior mediastinal mass.  Thymic tissue will typically have undergone involution at this time.  The differential considerations include thymic hyperplasia, thymoma, or lymphoma.  This does not appear to be contiguous with the  thyroid lobe and demonstrates lower attenuation than thyroid goiter which is also present. 2.  Thyroid goiter with 13 mm right thyroid lobe nodule. Consider further evaluation with thyroid ultrasound.  If patient is clinically hyperthyroid, consider nuclear medicine thyroid uptake and scan. 3.  Negative for pulmonary embolus.  No acute cardiopulmonary disease.  Original Report Authenticated By: Andreas Newport, M.D.    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

## 2011-09-22 DIAGNOSIS — E041 Nontoxic single thyroid nodule: Secondary | ICD-10-CM

## 2011-09-22 DIAGNOSIS — E0590 Thyrotoxicosis, unspecified without thyrotoxic crisis or storm: Secondary | ICD-10-CM

## 2011-09-22 MED ORDER — DOCUSATE SODIUM 50 MG/5ML PO LIQD: 100.0000 mg | Freq: Two times a day (BID) | ORAL | Status: DC

## 2011-09-22 MED ORDER — HYDROCOD POLST-CHLORPHEN POLST 10-8 MG/5ML PO LQCR: 5.0000 mL | Freq: Two times a day (BID) | ORAL | Status: DC | PRN

## 2011-09-22 MED ORDER — METHIMAZOLE 5 MG PO TABS
5.0000 mg | ORAL_TABLET | Freq: Every day | ORAL | Status: DC
  Administered 2011-09-22 – 2011-09-23 (×2): 5 mg via ORAL
  Filled 2011-09-22 (×2): qty 1

## 2011-09-22 MED ORDER — PROPRANOLOL HCL 10 MG PO TABS
10.0000 mg | ORAL_TABLET | Freq: Two times a day (BID) | ORAL | Status: DC
  Administered 2011-09-22 – 2011-09-23 (×2): 10 mg via ORAL
  Filled 2011-09-22 (×3): qty 1

## 2011-09-22 MED ORDER — IOHEXOL 350 MG/ML SOLN
80.0000 mL | Freq: Once | INTRAVENOUS | Status: AC | PRN
  Administered 2011-09-22: 80 mL via INTRAVENOUS

## 2011-09-22 NOTE — Plan of Care (Signed)
Problem: Phase I Progression Outcomes Goal: Initial discharge plan identified Outcome: Completed/Met Date Met:  09/22/11 To return home with family

## 2011-09-22 NOTE — Progress Notes (Signed)
Pt had small amount (aprox 10 cc) bright bloody sputum.

## 2011-09-22 NOTE — Progress Notes (Signed)
[facility] PROGRESS NOTE  Angel Kemp ZOX:[id number] DOB: 07/30/1970 DOA: 09/21/2011  Assessment/Plan: Principal Problem:  *Hemoptysis Active Problems:  Mediastinal mass  Thyroid nodule  Hyperthyroidism  1. Hemoptysis - the patient has not had a recurrence in the hospital. It is unclear if hemoptysis is related to the mediastinal mass. No coagulopathy present. Patient to undergo bronchoscopy August 16  2. Anterior mediastinal mass - plan to followup as an outpatient with CT scan in 3 months  3. Hyperthyroidism - most likely Graves' disease . Patient cannot undergo thyroid scan for 6 weeks . We'll start low-dose methimazole while she is waiting for the scan. We'll also start low-dose beta blocker Results for Angel, Kemp (MRN [id number]) as of 09/22/2011 15:44  Ref. Range 09/21/2011 09:08  TSH Latest Range: 0.350-4.500 uIU/mL 0.008 (L)  Free T4 Latest Range: 0.80-1.80 ng/dL 9.56 (H)  T3, Free Latest Range: 2.3-4.2 pg/mL 17.1 (H)   Code Status: full Disposition Plan: home   Brief narrative: 41 year old woman admitted with hemoptysis found to have a mediastinal mass on CT scan  Consultants:  Pulmonary  Thoracic surgery  Procedures:  CT scan chest  Antibiotics:  None  HPI/Subjective: No further hemoptysis, has a dry cough  Objective: Filed Vitals:   09/21/11 1448 09/21/11 2129 09/22/11 0515 09/22/11 1348  BP: 106/69 116/77 118/76 114/71  Pulse: 103 94 93 111  Temp: 98 F (36.7 C) 98.6 F (37 C) 97.5 F (36.4 C) 98.1 F (36.7 C)  TempSrc: Oral Oral Oral Oral  Resp: 20 18 18 18   Height:      Weight:      SpO2: 100% 94% 93% 98%    Intake/Output Summary (Last 24 hours) at 09/22/11 1555 Last data filed at 09/22/11 1349  Gross per 24 hour  Intake 1546.67 ml  Output      0 ml  Net 1546.67 ml   Filed Weights   09/21/11 0229  Weight: 61.689 kg (136 lb)    Exam:   General:  Alert oriented x3  Cardiovascular: Regular rate and  rhythm  Respiratory: Clear to auscultation bilaterally  Abdomen: Soft nontender  Data Reviewed: Basic Metabolic Panel:  Lab 09/21/11 2130 09/21/11 0103  NA 142 140  K 3.6 4.6  CL 105 104  CO2 25 24  GLUCOSE 130* 136*  BUN 12 14  CREATININE 0.38* 0.39*  CALCIUM 10.3 10.5  MG -- --  PHOS -- --   Liver Function Tests:  Lab 09/21/11 0908 09/21/11 0103  AST 15 25  ALT 19 21  ALKPHOS 118* 135*  BILITOT 0.4 0.4  PROT 7.3 8.3  ALBUMIN 3.9 4.2   No results found for this basename: LIPASE:5,AMYLASE:5 in the last 168 hours No results found for this basename: AMMONIA:5 in the last 168 hours CBC:  Lab 09/21/11 0908 09/21/11 0103  WBC 11.9* 9.6  NEUTROABS 6.3 --  HGB 13.1 14.2  HCT 37.6 41.0  MCV 86.4 86.9  PLT 260 317   Cardiac Enzymes: No results found for this basename: CKTOTAL:5,CKMB:5,CKMBINDEX:5,TROPONINI:5 in the last 168 hours BNP (last 3 results) No results found for this basename: PROBNP:3 in the last 8760 hours CBG: No results found for this basename: GLUCAP:5 in the last 168 hours  No results found for this or any previous visit (from the past 240 hour(s)).   Studies: Dg Chest 2 View  09/19/2011  *RADIOLOGY REPORT*  Clinical Data: Cough.  Spitting up blood.  CHEST - 2 VIEW  Comparison: PA and lateral chest  11/01/2006.  Findings: The lungs are clear.  Heart size is normal.  No pneumothorax or effusion.  No focal bony abnormality.  IMPRESSION: Negative chest.  Original Report Authenticated By: Bernadene Bell. Angel Kemp, M.D.   Ct Angio Chest W/cm &/or Wo Cm  09/21/2011  *RADIOLOGY REPORT*  Clinical Data: Hemoptysis.  Sore throat.  Blood change sputum. Coughing and bronchitis.  CT ANGIOGRAPHY CHEST  Technique:  Multidetector CT imaging of the chest using the standard protocol during bolus administration of intravenous contrast. Multiplanar reconstructed images including MIPs were obtained and reviewed to evaluate the vascular anatomy.  Contrast: 80mL OMNIPAQUE IOHEXOL  350 MG/ML SOLN  Comparison: Chest radiograph 09/19/2011.  Findings: Thyroid goiter is present.  13 mm nonspecific low density thyroid nodule is present in the medial right thyroid lobe.  There is no axillary adenopathy.  There is no hilar or discrete mediastinal adenopathy.  Anterior mediastinal abnormal soft tissue is present, which follows a configuration most compatible with thymic tissue however this should have undergone involution by this time.  The study is technically adequate for evaluation of pulmonary embolism.  No pulmonary embolus is present.  Normal three-vessel aortic arch. No airspace disease.  Incidental imaging the upper abdomen demonstrates a low-density 13 mm left hepatic lobe lesion, statistically likely a cyst.  There is no effusion. Small nonspecific subcutaneous lesion is present in the lateral aspect of the left axilla measuring 16 mm; this probably represents a sebaceous cyst.  IMPRESSION: 1.  Anterior mediastinal mass.  Thymic tissue will typically have undergone involution at this time.  The differential considerations include thymic hyperplasia, thymoma, or lymphoma.  This does not appear to be contiguous with the thyroid lobe and demonstrates lower attenuation than thyroid goiter which is also present. 2.  Thyroid goiter with 13 mm right thyroid lobe nodule. Consider further evaluation with thyroid ultrasound.  If patient is clinically hyperthyroid, consider nuclear medicine thyroid uptake and scan. 3.  Negative for pulmonary embolus.  No acute cardiopulmonary disease.  Original Report Authenticated By: Angel Kemp, M.D.    Scheduled Meds:    . DISCONTD: docusate  100 mg Oral BID   Continuous Infusions:    . sodium chloride 50 mL/hr at 09/21/11 2323    Principal Problem:  *Hemoptysis Active Problems:  Mediastinal mass  Thyroid nodule  Hyperthyroidism    Time spent: 20 minutes    Angel Kemp  [facility] Pager [phone]. If 7PM-7AM, please contact  night-coverage at [email or web address], password [facility] 09/22/2011, 3:55 PM  LOS: 1 day

## 2011-09-23 ENCOUNTER — Encounter (HOSPITAL_COMMUNITY)
Admission: EM | Disposition: A | Discharge status: Home / Self Care | Payer: Self-pay | Source: Home / Self Care | Attending: Internal Medicine

## 2011-09-23 ENCOUNTER — Inpatient Hospital Stay (HOSPITAL_COMMUNITY): Payer: Self-pay

## 2011-09-23 HISTORY — PX: VIDEO BRONCHOSCOPY: SHX5072

## 2011-09-23 SURGERY — VIDEO BRONCHOSCOPY WITHOUT FLUORO
Anesthesia: Moderate Sedation | Laterality: Bilateral | Wound class: Clean Contaminated

## 2011-09-23 MED ORDER — FENTANYL CITRATE 0.05 MG/ML IJ SOLN
INTRAMUSCULAR | Status: DC | PRN
  Administered 2011-09-23 (×2): 25 ug via INTRAVENOUS

## 2011-09-23 MED ORDER — PROPRANOLOL HCL 10 MG PO TABS: 10.0000 mg | ORAL_TABLET | Freq: Two times a day (BID) | ORAL | Status: DC

## 2011-09-23 MED ORDER — LIDOCAINE HCL 2 % EX GEL
CUTANEOUS | Status: DC | PRN
  Administered 2011-09-23: 1

## 2011-09-23 MED ORDER — MIDAZOLAM HCL 10 MG/2ML IJ SOLN
INTRAMUSCULAR | Status: DC | PRN
  Administered 2011-09-23 (×2): 2 mg via INTRAVENOUS

## 2011-09-23 MED ORDER — MIDAZOLAM HCL 5 MG/ML IJ SOLN
INTRAMUSCULAR | Status: AC
  Filled 2011-09-23: qty 2

## 2011-09-23 MED ORDER — FENTANYL CITRATE 0.05 MG/ML IJ SOLN
INTRAMUSCULAR | Status: AC
  Filled 2011-09-23: qty 4

## 2011-09-23 MED ORDER — SODIUM CHLORIDE 0.9 % IV SOLN
INTRAVENOUS | Status: DC
  Administered 2011-09-23: 10 mL/h via INTRAVENOUS

## 2011-09-23 MED ORDER — PHENYLEPHRINE HCL 0.25 % NA SOLN
NASAL | Status: DC | PRN
  Administered 2011-09-23: 2 via NASAL

## 2011-09-23 MED ORDER — HYDROCOD POLST-CHLORPHEN POLST 10-8 MG/5ML PO LQCR: 5.0000 mL | Freq: Two times a day (BID) | ORAL | Status: DC | PRN

## 2011-09-23 MED ORDER — LIDOCAINE HCL (PF) 1 % IJ SOLN
INTRAMUSCULAR | Status: DC | PRN
  Administered 2011-09-23: 5 mL

## 2011-09-23 MED ORDER — METHIMAZOLE 5 MG PO TABS: 5.0000 mg | ORAL_TABLET | Freq: Every day | ORAL | Status: DC

## 2011-09-23 NOTE — Progress Notes (Signed)
09/23/11 Patient to be discharged today,IV site removed, Discharge instructions reviewed with patient. Angel Kemp

## 2011-09-23 NOTE — Op Note (Signed)
Name:  Angel Kemp MRN:  [id number] DOB:  1970-10-25  OP NOTE  Procedure(s): Flexible bronchoscopy [phone])  Indications:  Hemoptysis  Consent:  Procedure, benefits, risks and alternatives discussed.  Questions answered.  Consent obtained.  Anesthesia:  Fentanyl (total 50 mcg) and Versed (total 4 mg) IV  Procedure summary:  Appropriate equipment was assembled.  The patient was brought to the operating room and identified as Angel Kemp.  Safety timeout was performed. After the appropriate level of sedation was assured, flexible video bronchoscope was lubricated and inserted through the right naris.  Total of 12 mL of 1% Lidocaine were administered through the bronchoscope to augment sedation.  Airway examination was performed bilaterally to subsegmental level.  Minimal clear secretions were noted, mucosa appeared normal and no endobronchial lesions were identified.  There was no blood noted inside the tracheobronchial tree.  Upon completion of the procedure some gingival bleeding was noted, which retrospectively could have been the source of hemoptysis.   Specimens sent:  None.  Complications:  No immediate complications were noted.  Hemodynamic parameters and oxygenation remained stable throughout the procedure.  Estimated blood loss:  Less then 5 mL.  Orlean Bradford, M.D. Pulmonary and Critical Care Medicine [facility] Cell: [phone]  09/23/2011, 12:43 PM

## 2011-09-23 NOTE — Progress Notes (Signed)
Video bronchoscopy intervention done. Airway exam intervention done

## 2011-09-23 NOTE — Discharge Summary (Signed)
Physician Discharge Summary  Angel Kemp NGE:[id number] DOB: 08-31-1970 DOA: 09/21/2011  PCP: Sheila Oats, MD  Admit date: 09/21/2011 Discharge date: 09/23/2011  Recommendations for Outpatient Follow-up:  1. Patient needs followup TSH in one month 2. Patient needs thyroid scan in 6 weeks 3. Patient needs CT scan chest in 3 months  Discharge Diagnoses:  Hemoptysis - no recurrence in the hospital-bronchoscopy did not find any source to explain  Mediastinal mass - plan for outpatient followup with [facility] Newly diagnosed Hyperthyroidism - presumed Graves' disease - patient needs thyroid scan in 6 weeks   Discharge Condition: Good  Diet recommendation: Regular  Filed Weights   09/21/11 0229  Weight: 61.689 kg (136 lb)    History of present illness:  41 year old woman presented emergency room with hemoptysis  Hospital Course:   1. Hemoptysis - the patient has not had a recurrence in the hospital. It is difficult to imagine that hemoptysis is related to the mediastinal mass. No coagulopathy present. Patient underwent  bronchoscopy on August 16 and no lesion was found to explain hemoptysis. 2. Anterior mediastinal mass - plan to followup as an outpatient with CT scan in 3 months as per Dr. Dennie Maizes recommendations. Patient was prescribed a symptomatic medication for cough 3. Hyperthyroidism - most likely Graves' disease . Patient cannot undergo thyroid scan for 6 weeks because she had a CT scan of the chest with IV contrast. She was started on low-dose methimazole while she is waiting for the scan. We also started low-dose beta blocker.  Procedures:  CT scan chest  Bronchoscopy  Consultations:  Thoracic surgery  Pulmonary  Discharge Exam: Filed Vitals:   09/23/11 0900  BP: 111/81  Pulse:   Temp:   Resp: 22   Filed Vitals:   09/23/11 0830 09/23/11 0840 09/23/11 0850 09/23/11 0900  BP: 121/62 183/77 122/80 111/81  Pulse:      Temp:      TempSrc:      Resp: 32  27 29 22   Height:      Weight:      SpO2: 94% 96% 97% 97%    General: Alert oriented x3 Cardiovascular: Regular rate and rhythm without murmurs rubs or gallops  Respiratory: Clear to auscultation bilaterally   Discharge Instructions  Discharge Orders    Future Orders Please Complete By Expires   Diet general      Increase activity slowly        Medication List  As of 09/23/2011 11:33 AM   STOP taking these medications         albuterol 108 (90 BASE) MCG/ACT inhaler      azithromycin 250 MG tablet      ibuprofen 200 MG tablet      predniSONE 10 MG tablet         TAKE these medications         chlorpheniramine-HYDROcodone 10-8 MG/5ML Lqcr   Commonly known as: TUSSIONEX   Take 5 mLs by mouth every 12 (twelve) hours as needed.      methimazole 5 MG tablet   Commonly known as: TAPAZOLE   Take 1 tablet (5 mg total) by mouth daily.      propranolol 10 MG tablet   Commonly known as: INDERAL   Take 1 tablet (10 mg total) by mouth 2 (two) times daily.           Follow-up Information    Schedule an appointment as soon as possible for a visit with [facility].  Contact information:   [phone]      Follow up with GERHARDT,EDWARD B, MD. Schedule an appointment as soon as possible for a visit in 3 months.   Contact information:   [address] [place] [address] [phone]           The results of significant diagnostics from this hospitalization (including imaging, microbiology, ancillary and laboratory) are listed below for reference.    Significant Diagnostic Studies: Dg Chest 2 View  09/19/2011  *RADIOLOGY REPORT*  Clinical Data: Cough.  Spitting up blood.  CHEST - 2 VIEW  Comparison: PA and lateral chest 11/01/2006.  Findings: The lungs are clear.  Heart size is normal.  No pneumothorax or effusion.  No focal bony abnormality.  IMPRESSION: Negative chest.  Original Report Authenticated By: Bernadene Bell. Maricela Curet, M.D.    Ct Angio Chest W/cm &/or Wo Cm  09/21/2011  *RADIOLOGY REPORT*  Clinical Data: Hemoptysis.  Sore throat.  Blood change sputum. Coughing and bronchitis.  CT ANGIOGRAPHY CHEST  Technique:  Multidetector CT imaging of the chest using the standard protocol during bolus administration of intravenous contrast. Multiplanar reconstructed images including MIPs were obtained and reviewed to evaluate the vascular anatomy.  Contrast: 80mL OMNIPAQUE IOHEXOL 350 MG/ML SOLN  Comparison: Chest radiograph 09/19/2011.  Findings: Thyroid goiter is present.  13 mm nonspecific low density thyroid nodule is present in the medial right thyroid lobe.  There is no axillary adenopathy.  There is no hilar or discrete mediastinal adenopathy.  Anterior mediastinal abnormal soft tissue is present, which follows a configuration most compatible with thymic tissue however this should have undergone involution by this time.  The study is technically adequate for evaluation of pulmonary embolism.  No pulmonary embolus is present.  Normal three-vessel aortic arch. No airspace disease.  Incidental imaging the upper abdomen demonstrates a low-density 13 mm left hepatic lobe lesion, statistically likely a cyst.  There is no effusion. Small nonspecific subcutaneous lesion is present in the lateral aspect of the left axilla measuring 16 mm; this probably represents a sebaceous cyst.  IMPRESSION: 1.  Anterior mediastinal mass.  Thymic tissue will typically have undergone involution at this time.  The differential considerations include thymic hyperplasia, thymoma, or lymphoma.  This does not appear to be contiguous with the thyroid lobe and demonstrates lower attenuation than thyroid goiter which is also present. 2.  Thyroid goiter with 13 mm right thyroid lobe nodule. Consider further evaluation with thyroid ultrasound.  If patient is clinically hyperthyroid, consider nuclear medicine thyroid uptake and scan. 3.  Negative for pulmonary embolus.  No  acute cardiopulmonary disease.  Original Report Authenticated By: Andreas Newport, M.D.    Microbiology: No results found for this or any previous visit (from the past 240 hour(s)).   Labs: Basic Metabolic Panel:  Lab 09/21/11 8657 09/21/11 0103  NA 142 140  K 3.6 4.6  CL 105 104  CO2 25 24  GLUCOSE 130* 136*  BUN 12 14  CREATININE 0.38* 0.39*  CALCIUM 10.3 10.5  MG -- --  PHOS -- --   Liver Function Tests:  Lab 09/21/11 0908 09/21/11 0103  AST 15 25  ALT 19 21  ALKPHOS 118* 135*  BILITOT 0.4 0.4  PROT 7.3 8.3  ALBUMIN 3.9 4.2   No results found for this basename: LIPASE:5,AMYLASE:5 in the last 168 hours No results found for this basename: AMMONIA:5 in the last 168 hours CBC:  Lab 09/21/11 0908 09/21/11 0103  WBC 11.9*  9.6  NEUTROABS 6.3 --  HGB 13.1 14.2  HCT 37.6 41.0  MCV 86.4 86.9  PLT 260 317   Cardiac Enzymes: No results found for this basename: CKTOTAL:5,CKMB:5,CKMBINDEX:5,TROPONINI:5 in the last 168 hours BNP: BNP (last 3 results) No results found for this basename: PROBNP:3 in the last 8760 hours CBG: No results found for this basename: GLUCAP:5 in the last 168 hours  Time coordinating discharge: 40 minutes  Signed:  Jerryl Holzhauer  [facility] 09/23/2011, 11:33 AM

## 2011-09-26 ENCOUNTER — Encounter (HOSPITAL_COMMUNITY): Payer: Self-pay

## 2011-09-26 ENCOUNTER — Encounter (HOSPITAL_COMMUNITY): Payer: Self-pay | Admitting: Pulmonary Disease

## 2011-11-11 ENCOUNTER — Ambulatory Visit: Payer: Self-pay | Admitting: Endocrinology

## 2011-11-14 ENCOUNTER — Ambulatory Visit (INDEPENDENT_AMBULATORY_CARE_PROVIDER_SITE_OTHER): Payer: Self-pay | Admitting: Endocrinology

## 2011-11-14 ENCOUNTER — Encounter: Payer: Self-pay | Admitting: Endocrinology

## 2011-11-14 VITALS — BP 138/60 | HR 71 | Temp 97.4°F | Resp 16 | Ht 65.5 in | Wt 152.1 lb

## 2011-11-14 DIAGNOSIS — E0590 Thyrotoxicosis, unspecified without thyrotoxic crisis or storm: Secondary | ICD-10-CM

## 2011-11-14 NOTE — Progress Notes (Signed)
Subjective:    Patient ID: Angel Kemp, female    DOB: 1970/03/09, 41 y.o.   MRN: [id number]  HPI Pt was in the hospital 6 weeks ago for a chest mass, and was found to have hyperthyroidism.  She was rx'ed tapazole and inderal.  She has slight tremor of the hands, and assoc dizziness.  She has had tubal ligation. Past Medical History  Diagnosis Date  . No pertinent past medical history   . Family history of anesthesia complication     mother  . Cough with hemoptysis 09/21/2011  . Mass in chest 09/21/2011  . Allergy     Past Surgical History  Procedure Date  . Tubal ligation   . Video bronchoscopy 09/23/2011    Procedure: VIDEO BRONCHOSCOPY WITHOUT FLUORO;  Surgeon: Lonia Farber, MD;  Location: [facility] ENDOSCOPY;  Service: Cardiopulmonary;  Laterality: Bilateral;    History   Social History  . Marital Status: Single    Spouse Name: N/A    Number of Children: N/A  . Years of Education: N/A   Occupational History  . Not on file.   Social History Main Topics  . Smoking status: Former Smoker    Quit date: 07/07/2011  . Smokeless tobacco: Never Used  . Alcohol Use: No  . Drug Use: No  . Sexually Active: Not Currently   Other Topics Concern  . Not on file   Social History Narrative  . No narrative on file    Current Outpatient Prescriptions on File Prior to Visit  Medication Sig Dispense Refill  . methimazole (TAPAZOLE) 5 MG tablet Take 1 tablet (5 mg total) by mouth daily.  30 tablet  0  . propranolol (INDERAL) 10 MG tablet Take 1 tablet (10 mg total) by mouth 2 (two) times daily.  60 tablet  0  . chlorpheniramine-HYDROcodone (TUSSIONEX) 10-8 MG/5ML LQCR Take 5 mLs by mouth every 12 (twelve) hours as needed.  140 mL  0    No Known Allergies  Family History  Problem Relation Age of Onset  . Hypertension Mother   . Diabetes type II Mother   no thyroid probs  BP 138/60  Pulse 71  Temp 97.4 F (36.3 C) (Oral)  Resp 16  Ht 5' 5.5" (1.664 m)  Wt 152 lb  1 oz (68.975 kg)  BMI 24.92 kg/m2  SpO2 98%  LMP 11/13/2011  Review of Systems denies headache, hoarseness, double vision, sob, diarrhea, numbness, and seizure.   She has weight gain, myalgias, anxiety, excessive diaphoresis, easy bruising, rhinorrhea, urinary frequency, and palpitations.  She has reg menses.    Objective:   Physical Exam VS: see vs page GEN: no distress HEAD: head: no deformity eyes: no periorbital swelling; bilat proptosis external nose and ears are normal mouth: no lesion seen NECK: 10x normal size thyroid, (R>L).  No discrete nodule CHEST WALL: no deformity LUNGS:  Clear to auscultation CV: reg rate and rhythm, no murmur ABD: abdomen is soft, nontender.  no hepatosplenomegaly.  not distended.  no hernia.  MUSCULOSKELETAL: muscle bulk and strength are grossly normal.  no obvious joint swelling.  gait is normal and steady EXTEMITIES: no deformity.  no edema PULSES: dorsalis pedis intact bilat.  no carotid bruit NEURO:  cn 2-12 grossly intact.   readily moves all 4's.  sensation is intact to touch on the feet.  No tremor. SKIN:  Normal texture and temperature.  No rash or suspicious lesion is visible.   NODES:  None palpable at the neck  PSYCH: alert, oriented x3.  Does not appear anxious nor depressed.  Lab Results  Component Value Date   TSH 0.008* 11/14/2011      Assessment & Plan:  Hyperthyroidism, uncertain etiology, needs increased rx Chest mass; does not sem thyroid-related. Weight-gain, not thyroid-related

## 2011-11-14 NOTE — Patient Instructions (Addendum)
blood tests are being requested for you today.  You will be contacted with results. Stop taking the propanolol. When you get medicaid, you should consider taking the radioactive iodine, to destroy the thyroid.  Until then, you can take the methimazole if ever you have fever while taking methimazole, stop it and call us, because of the risk of a rare side-effect. Please come back for a follow-up appointment for 1 month.

## 2011-11-15 ENCOUNTER — Emergency Department (HOSPITAL_COMMUNITY)
Admission: EM | Admit: 2011-11-15 | Discharge: 2011-11-15 | Disposition: A | Discharge status: Home / Self Care | Payer: Self-pay | Attending: Emergency Medicine | Admitting: Emergency Medicine

## 2011-11-15 DIAGNOSIS — Z8249 Family history of ischemic heart disease and other diseases of the circulatory system: Secondary | ICD-10-CM | POA: Insufficient documentation

## 2011-11-15 DIAGNOSIS — Z8489 Family history of other specified conditions: Secondary | ICD-10-CM | POA: Insufficient documentation

## 2011-11-15 DIAGNOSIS — K029 Dental caries, unspecified: Secondary | ICD-10-CM | POA: Insufficient documentation

## 2011-11-15 DIAGNOSIS — Z833 Family history of diabetes mellitus: Secondary | ICD-10-CM | POA: Insufficient documentation

## 2011-11-15 DIAGNOSIS — Z87891 Personal history of nicotine dependence: Secondary | ICD-10-CM | POA: Insufficient documentation

## 2011-11-15 LAB — TSH: TSH: 0.008 u[IU]/mL — ABNORMAL LOW (ref 0.350–4.500)

## 2011-11-15 MED ORDER — METHIMAZOLE 10 MG PO TABS: ORAL_TABLET | ORAL | Status: DC

## 2011-11-15 MED ORDER — OXYCODONE-ACETAMINOPHEN 5-325 MG PO TABS: 1.0000 | ORAL_TABLET | Freq: Once | ORAL | Status: DC

## 2011-11-15 MED ORDER — OXYCODONE-ACETAMINOPHEN 5-325 MG PO TABS
1.0000 | ORAL_TABLET | Freq: Once | ORAL | Status: AC
  Administered 2011-11-15: 1 via ORAL
  Filled 2011-11-15: qty 1

## 2011-11-15 MED ORDER — PENICILLIN V POTASSIUM 500 MG PO TABS: 500.0000 mg | ORAL_TABLET | Freq: Three times a day (TID) | ORAL | Status: DC

## 2011-11-15 NOTE — ED Notes (Signed)
Pt c/o pain on right upper and lower jaw. Teeth are missing.[email or web address].pwd

## 2011-11-15 NOTE — ED Provider Notes (Signed)
Medical screening examination/treatment/procedure(s) were performed by non-physician practitioner and as supervising physician I was immediately available for consultation/collaboration.  Toy Baker, MD 11/15/11 [phone]

## 2011-11-15 NOTE — ED Provider Notes (Signed)
History     CSN: [id number]  Arrival date & time 11/15/11  0121   First MD Initiated Contact with Patient 11/15/11 [phone]      No chief complaint on file.   (Consider location/radiation/quality/duration/timing/severity/associated sxs/prior treatment) Patient is a 41 y.o. female presenting with tooth pain. The history is provided by the patient.  Dental PainThe primary symptoms include mouth pain. Primary symptoms do not include fever. The symptoms began more than 1 week ago. The symptoms are unchanged.  Additional symptoms do not include: facial swelling and trouble swallowing. Associated symptoms comments: She reports two upper molars that broke and one lower molar with decay that are causing pain. No fever, or facial swelling..    Past Medical History  Diagnosis Date  . No pertinent past medical history   . Family history of anesthesia complication     mother  . Cough with hemoptysis 09/21/2011  . Mass in chest 09/21/2011  . Allergy     Past Surgical History  Procedure Date  . Tubal ligation   . Video bronchoscopy 09/23/2011    Procedure: VIDEO BRONCHOSCOPY WITHOUT FLUORO;  Surgeon: Lonia Farber, MD;  Location: [facility] ENDOSCOPY;  Service: Cardiopulmonary;  Laterality: Bilateral;    Family History  Problem Relation Age of Onset  . Hypertension Mother   . Diabetes type II Mother     History  Substance Use Topics  . Smoking status: Former Smoker    Quit date: 07/07/2011  . Smokeless tobacco: Never Used  . Alcohol Use: No    OB History    Grav Para Term Preterm Abortions TAB SAB Ect Mult Living                  Review of Systems  Constitutional: Negative for fever and chills.  HENT: Positive for dental problem. Negative for facial swelling and trouble swallowing.   Skin: Negative.   Neurological: Negative.     Allergies  Review of patient's allergies indicates no known allergies.  Home Medications   Current Outpatient Rx  Name Route Sig Dispense  Refill  . ATENOLOL 50 MG PO TABS Oral Take 50 mg by mouth daily.    Marland Kitchen METHIMAZOLE 10 MG PO TABS Oral Take 10 mg by mouth 2 (two) times daily.    Marland Kitchen PROPRANOLOL HCL 10 MG PO TABS Oral Take 10 mg by mouth 2 (two) times daily.      BP 143/73  Pulse 61  Resp 20  SpO2 97%  LMP 11/13/2011  Physical Exam  Constitutional: She appears well-developed and well-nourished.  HENT:  Head: Normocephalic.       Fair dentition. Upper right molars are unremarkable in apperance. No gingival swelling. Lower right rear molar with significant decay.   Neck: Normal range of motion. Neck supple.  Pulmonary/Chest: Effort normal.  Lymphadenopathy:    She has no cervical adenopathy.  Neurological: She is alert.  Skin: Skin is warm and dry. No rash noted.  Psychiatric: She has a normal mood and affect.    ED Course  Procedures (including critical care time)  Labs Reviewed - No data to display No results found.   No diagnosis found.  1. Toothache 2. Dental caries   MDM  Painfu but Uncomplicated dental caries.        Rodena Medin, PA-C 11/15/11 [phone]

## 2011-12-16 ENCOUNTER — Ambulatory Visit: Payer: Self-pay | Admitting: Endocrinology

## 2012-02-09 ENCOUNTER — Emergency Department (HOSPITAL_COMMUNITY): Payer: Self-pay

## 2012-02-09 ENCOUNTER — Encounter (HOSPITAL_COMMUNITY): Payer: Self-pay

## 2012-02-09 ENCOUNTER — Emergency Department (HOSPITAL_COMMUNITY)
Admission: EM | Admit: 2012-02-09 | Discharge: 2012-02-09 | Disposition: A | Discharge status: Home / Self Care | Payer: Self-pay | Attending: Emergency Medicine | Admitting: Emergency Medicine

## 2012-02-09 DIAGNOSIS — J029 Acute pharyngitis, unspecified: Secondary | ICD-10-CM | POA: Insufficient documentation

## 2012-02-09 DIAGNOSIS — R059 Cough, unspecified: Secondary | ICD-10-CM

## 2012-02-09 DIAGNOSIS — E0500 Thyrotoxicosis with diffuse goiter without thyrotoxic crisis or storm: Secondary | ICD-10-CM | POA: Insufficient documentation

## 2012-02-09 DIAGNOSIS — R079 Chest pain, unspecified: Secondary | ICD-10-CM | POA: Insufficient documentation

## 2012-02-09 DIAGNOSIS — Z87891 Personal history of nicotine dependence: Secondary | ICD-10-CM | POA: Insufficient documentation

## 2012-02-09 DIAGNOSIS — R05 Cough: Secondary | ICD-10-CM

## 2012-02-09 DIAGNOSIS — R222 Localized swelling, mass and lump, trunk: Secondary | ICD-10-CM | POA: Insufficient documentation

## 2012-02-09 DIAGNOSIS — R0602 Shortness of breath: Secondary | ICD-10-CM | POA: Insufficient documentation

## 2012-02-09 HISTORY — DX: Nontoxic goiter, unspecified: E04.9

## 2012-02-09 LAB — CBC
Platelets: 240 10*3/uL (ref 150–400)
RBC: 4.15 MIL/uL (ref 3.87–5.11)
WBC: 8.6 10*3/uL (ref 4.0–10.5)

## 2012-02-09 LAB — BASIC METABOLIC PANEL
CO2: 23 mEq/L (ref 19–32)
Calcium: 9.8 mg/dL (ref 8.4–10.5)
Chloride: 101 mEq/L (ref 96–112)
GFR calc Af Amer: >90 mL/min (ref >90)
Sodium: 137 mEq/L (ref 135–145)

## 2012-02-09 LAB — PRO B NATRIURETIC PEPTIDE: Pro B Natriuretic peptide (BNP): <5 pg/mL (ref 0–125)

## 2012-02-09 LAB — POCT I-STAT TROPONIN I: Troponin i, poc: 0 ng/mL (ref 0.00–0.08)

## 2012-02-09 MED ORDER — IPRATROPIUM BROMIDE 0.02 % IN SOLN
0.5000 mg | Freq: Once | RESPIRATORY_TRACT | Status: AC
  Administered 2012-02-09: 0.5 mg via RESPIRATORY_TRACT
  Filled 2012-02-09: qty 2.5

## 2012-02-09 MED ORDER — ALBUTEROL SULFATE (5 MG/ML) 0.5% IN NEBU
5.0000 mg | INHALATION_SOLUTION | Freq: Once | RESPIRATORY_TRACT | Status: AC
  Administered 2012-02-09: 5 mg via RESPIRATORY_TRACT
  Filled 2012-02-09: qty 1

## 2012-02-09 MED ORDER — GUAIFENESIN 100 MG/5ML PO SOLN
5.0000 mL | Freq: Once | ORAL | Status: AC
  Administered 2012-02-09: 100 mg via ORAL
  Filled 2012-02-09: qty 5

## 2012-02-09 MED ORDER — ALBUTEROL SULFATE HFA 108 (90 BASE) MCG/ACT IN AERS: 2.0000 | INHALATION_SPRAY | RESPIRATORY_TRACT | Status: DC | PRN

## 2012-02-09 NOTE — ED Notes (Addendum)
Pt reports she was dx in August w/a goiter and a thyroid condition, pt sts "I think it's hyperthyroid." reports difficulty swallowing, heart palpitations, SOB, and mid-sternum chest pain x2 days. Pt is waiting for medicaid to go through before she can continue care w/her Endocrinologist.

## 2012-02-09 NOTE — ED Notes (Signed)
Pt st's she has had fever, sore throat, and aching all over for past 2-3 days.  Also c/o cough.

## 2012-02-09 NOTE — ED Provider Notes (Addendum)
History     CSN: [id number]  Arrival date & time 02/09/12  5621   First MD Initiated Contact with Patient 02/09/12 2103      Chief Complaint  Patient presents with  . Goiter  . Chest Pain    (Consider location/radiation/quality/duration/timing/severity/associated sxs/prior treatment) The history is provided by the patient.  Angel Kemp is a 42 y.o. female hx of hyperthyroidism with goiter here with SOB, cough. Persistent cough for several months, worse in the last 2 days. Nonproductive, no fevers. She was a former smoker. She has been taking her methimazole. + sore throat as well. No vomiting or ab pain or diarrhea. No hx of CAD or stents.    Past Medical History  Diagnosis Date  . No pertinent past medical history   . Family history of anesthesia complication     mother  . Cough with hemoptysis 09/21/2011  . Mass in chest 09/21/2011  . Allergy   . Goiter     Past Surgical History  Procedure Date  . Tubal ligation   . Video bronchoscopy 09/23/2011    Procedure: VIDEO BRONCHOSCOPY WITHOUT FLUORO;  Surgeon: Lonia Farber, MD;  Location: [facility] ENDOSCOPY;  Service: Cardiopulmonary;  Laterality: Bilateral;    Family History  Problem Relation Age of Onset  . Hypertension Mother   . Diabetes type II Mother     History  Substance Use Topics  . Smoking status: Former Smoker    Quit date: 07/07/2011  . Smokeless tobacco: Never Used  . Alcohol Use: No    OB History    Grav Para Term Preterm Abortions TAB SAB Ect Mult Living                  Review of Systems  Respiratory: Positive for shortness of breath.   Cardiovascular: Positive for chest pain.  All other systems reviewed and are negative.    Allergies  Review of patient's allergies indicates no known allergies.  Home Medications   Current Outpatient Rx  Name  Route  Sig  Dispense  Refill  . METHIMAZOLE 10 MG PO TABS   Oral   Take 20 mg by mouth 2 (two) times daily.           BP 154/97   Pulse 120  Temp 99 F (37.2 C) (Oral)  Resp 18  SpO2 97%  LMP 02/05/2012  Physical Exam  Nursing note and vitals reviewed. Constitutional: She is oriented to person, place, and time. She appears well-developed and well-nourished.       Anxious   HENT:  Head: Normocephalic.  Mouth/Throat: Oropharynx is clear and moist.  Eyes: Conjunctivae normal are normal. Pupils are equal, round, and reactive to light.       OP clear, not red   Neck: Normal range of motion. Neck supple.       + goiter, nontender. No cervical LAD   Cardiovascular: Regular rhythm and normal heart sounds.        Tachy   Pulmonary/Chest: Effort normal and breath sounds normal. No respiratory distress. She has no wheezes. She has no rales.  Abdominal: Soft. Bowel sounds are normal. She exhibits no distension. There is no tenderness. There is no rebound.  Musculoskeletal: Normal range of motion.  Neurological: She is alert and oriented to person, place, and time.  Skin: Skin is warm and dry.  Psychiatric: She has a normal mood and affect. Her behavior is normal. Judgment and thought content normal.    ED Course  Procedures (including critical care time)  Labs Reviewed  CBC - Abnormal; Notable for the following:    Hemoglobin 11.8 (*)     All other components within normal limits  BASIC METABOLIC PANEL - Abnormal; Notable for the following:    Glucose, Bld 118 (*)     Creatinine, Ser 0.44 (*)     All other components within normal limits  PRO B NATRIURETIC PEPTIDE  POCT I-STAT TROPONIN I   Dg Chest 2 View  02/09/2012  *RADIOLOGY REPORT*  Clinical Data: Goiter  CHEST - 2 VIEW  Comparison: 09/21/2011  Findings: Normal heart size.  No pleural effusion or edema.  No airspace consolidation identified.  Review of the visualized osseous structures is unremarkable.  IMPRESSION:  1.  No acute cardiopulmonary abnormalities.   Original Report Authenticated By: Signa Kell, M.D.      No diagnosis found.   Date:  02/09/2012  Rate: 106  Rhythm: sinus tachycardia  QRS Axis: normal  Intervals: normal  ST/T Wave abnormalities: normal  Conduction Disutrbances:none  Narrative Interpretation:   Old EKG Reviewed: none available    MDM  Angel Kemp is a 42 y.o. female here with cough.  She said that tachycardia is chronic from the goiter. Low risk for PE. Labs and CXR nl. Not concerning for strep throat (Centor 0/4). She felt better after robitussin and albuterol and will send home with the same.   11:36 PM BNP sent in triage, I didn't order it and she has no signs of heart failure. BNP was normal.        Richardean Canal, MD 02/09/12 2333  Richardean Canal, MD 02/09/12 1610  Richardean Canal, MD 02/09/12 2337

## 2012-07-22 ENCOUNTER — Observation Stay (HOSPITAL_COMMUNITY)
Admission: EM | Admit: 2012-07-22 | Discharge: 2012-07-25 | Disposition: A | Discharge status: Home / Self Care | Payer: BC Managed Care – PPO | Attending: Internal Medicine | Admitting: Internal Medicine

## 2012-07-22 ENCOUNTER — Encounter (HOSPITAL_COMMUNITY): Payer: Self-pay | Admitting: *Deleted

## 2012-07-22 DIAGNOSIS — E876 Hypokalemia: Secondary | ICD-10-CM | POA: Insufficient documentation

## 2012-07-22 DIAGNOSIS — R0789 Other chest pain: Principal | ICD-10-CM | POA: Insufficient documentation

## 2012-07-22 DIAGNOSIS — E041 Nontoxic single thyroid nodule: Secondary | ICD-10-CM

## 2012-07-22 DIAGNOSIS — E0590 Thyrotoxicosis, unspecified without thyrotoxic crisis or storm: Secondary | ICD-10-CM

## 2012-07-22 DIAGNOSIS — J9859 Other diseases of mediastinum, not elsewhere classified: Secondary | ICD-10-CM

## 2012-07-22 DIAGNOSIS — E0500 Thyrotoxicosis with diffuse goiter without thyrotoxic crisis or storm: Secondary | ICD-10-CM | POA: Insufficient documentation

## 2012-07-22 DIAGNOSIS — I498 Other specified cardiac arrhythmias: Secondary | ICD-10-CM | POA: Insufficient documentation

## 2012-07-22 DIAGNOSIS — R079 Chest pain, unspecified: Secondary | ICD-10-CM | POA: Diagnosis present

## 2012-07-22 DIAGNOSIS — R222 Localized swelling, mass and lump, trunk: Secondary | ICD-10-CM | POA: Insufficient documentation

## 2012-07-22 DIAGNOSIS — D72829 Elevated white blood cell count, unspecified: Secondary | ICD-10-CM | POA: Insufficient documentation

## 2012-07-22 HISTORY — DX: Thyrotoxicosis, unspecified without thyrotoxic crisis or storm: E05.90

## 2012-07-22 HISTORY — DX: Personal history of other diseases of urinary system: Z87.448

## 2012-07-22 HISTORY — DX: Personal history of other specified conditions: Z87.898

## 2012-07-22 LAB — BASIC METABOLIC PANEL
CO2: 24 mEq/L (ref 19–32)
Calcium: 9.5 mg/dL (ref 8.4–10.5)
GFR calc Af Amer: >90 mL/min (ref >90)
Sodium: 141 mEq/L (ref 135–145)

## 2012-07-22 LAB — POCT I-STAT TROPONIN I: Troponin i, poc: 0.01 ng/mL (ref 0.00–0.08)

## 2012-07-22 LAB — CBC
MCH: 28.8 pg (ref 26.0–34.0)
Platelets: 293 10*3/uL (ref 150–400)
RBC: 4.17 MIL/uL (ref 3.87–5.11)
RDW: 13.2 % (ref 11.5–15.5)
WBC: 12.9 10*3/uL — ABNORMAL HIGH (ref 4.0–10.5)

## 2012-07-22 NOTE — ED Notes (Signed)
Pt in stating she was sent by her job due to elevated BP and high heart rate, when asked why these were checked pt states she has been having chest pain today and headache over the last week. Pt states she is noncompliant with her BP medication. Pt states pain is to right side of her chest and radiates into her back, also nausea.

## 2012-07-23 ENCOUNTER — Emergency Department (HOSPITAL_COMMUNITY): Payer: BC Managed Care – PPO

## 2012-07-23 ENCOUNTER — Encounter (HOSPITAL_COMMUNITY): Payer: Self-pay | Admitting: Radiology

## 2012-07-23 DIAGNOSIS — R222 Localized swelling, mass and lump, trunk: Secondary | ICD-10-CM

## 2012-07-23 DIAGNOSIS — E0590 Thyrotoxicosis, unspecified without thyrotoxic crisis or storm: Secondary | ICD-10-CM

## 2012-07-23 DIAGNOSIS — R079 Chest pain, unspecified: Secondary | ICD-10-CM | POA: Diagnosis present

## 2012-07-23 LAB — URINALYSIS, ROUTINE W REFLEX MICROSCOPIC
Bilirubin Urine: NEGATIVE
Hgb urine dipstick: NEGATIVE
Ketones, ur: NEGATIVE mg/dL
Nitrite: NEGATIVE
Urobilinogen, UA: 0.2 mg/dL (ref 0.0–1.0)

## 2012-07-23 LAB — PREGNANCY, URINE: Preg Test, Ur: NEGATIVE

## 2012-07-23 LAB — T3, FREE: T3, Free: 11.9 pg/mL — ABNORMAL HIGH (ref 2.3–4.2)

## 2012-07-23 LAB — CBC WITH DIFFERENTIAL/PLATELET
Eosinophils Absolute: 0.3 10*3/uL (ref 0.0–0.7)
Eosinophils Relative: 3 % (ref 0–5)
HCT: 33.5 % — ABNORMAL LOW (ref 36.0–46.0)
Hemoglobin: 11.5 g/dL — ABNORMAL LOW (ref 12.0–15.0)
Lymphs Abs: 5.4 10*3/uL — ABNORMAL HIGH (ref 0.7–4.0)
MCH: 28.5 pg (ref 26.0–34.0)
MCV: 83.1 fL (ref 78.0–100.0)
Monocytes Relative: 8 % (ref 3–12)
RBC: 4.03 MIL/uL (ref 3.87–5.11)

## 2012-07-23 LAB — RAPID URINE DRUG SCREEN, HOSP PERFORMED
Amphetamines: NOT DETECTED
Barbiturates: NOT DETECTED
Benzodiazepines: NOT DETECTED
Cocaine: NOT DETECTED
Opiates: POSITIVE — AB
Tetrahydrocannabinol: NOT DETECTED

## 2012-07-23 LAB — COMPREHENSIVE METABOLIC PANEL
AST: 13 U/L (ref 0–37)
Albumin: 3.4 g/dL — ABNORMAL LOW (ref 3.5–5.2)
Calcium: 9.4 mg/dL (ref 8.4–10.5)
Chloride: 111 mEq/L (ref 96–112)
Creatinine, Ser: 0.43 mg/dL — ABNORMAL LOW (ref 0.50–1.10)

## 2012-07-23 LAB — TROPONIN I
Troponin I: <0.3 ng/mL (ref <0.30)
Troponin I: <0.3 ng/mL (ref <0.30)

## 2012-07-23 LAB — TSH: TSH: 0.01 u[IU]/mL — ABNORMAL LOW (ref 0.350–4.500)

## 2012-07-23 LAB — T4, FREE: Free T4: 3.83 ng/dL — ABNORMAL HIGH (ref 0.80–1.80)

## 2012-07-23 MED ORDER — SODIUM CHLORIDE 0.9 % IJ SOLN
3.0000 mL | Freq: Two times a day (BID) | INTRAMUSCULAR | Status: DC
  Administered 2012-07-23 – 2012-07-25 (×4): 3 mL via INTRAVENOUS

## 2012-07-23 MED ORDER — ONDANSETRON HCL 4 MG PO TABS: 4.0000 mg | ORAL_TABLET | Freq: Four times a day (QID) | ORAL | Status: DC | PRN

## 2012-07-23 MED ORDER — SODIUM CHLORIDE 0.9 % IV SOLN: INTRAVENOUS | Status: DC

## 2012-07-23 MED ORDER — SODIUM CHLORIDE 0.9 % IV BOLUS (SEPSIS): 1000.0000 mL | Freq: Once | INTRAVENOUS | Status: AC

## 2012-07-23 MED ORDER — ACETAMINOPHEN 325 MG PO TABS
650.0000 mg | ORAL_TABLET | Freq: Four times a day (QID) | ORAL | Status: DC | PRN
  Administered 2012-07-23 (×2): 650 mg via ORAL
  Filled 2012-07-23 (×2): qty 2

## 2012-07-23 MED ORDER — POTASSIUM CHLORIDE 10 MEQ/100ML IV SOLN
10.0000 meq | Freq: Once | INTRAVENOUS | Status: AC
  Administered 2012-07-23: 10 meq via INTRAVENOUS
  Filled 2012-07-23: qty 100

## 2012-07-23 MED ORDER — ATENOLOL 25 MG PO TABS
25.0000 mg | ORAL_TABLET | Freq: Every day | ORAL | Status: DC
  Administered 2012-07-23 – 2012-07-25 (×3): 25 mg via ORAL
  Filled 2012-07-23 (×3): qty 1

## 2012-07-23 MED ORDER — SODIUM CHLORIDE 0.9 % IV SOLN
Freq: Once | INTRAVENOUS | Status: AC
  Administered 2012-07-23: 01:00:00 via INTRAVENOUS

## 2012-07-23 MED ORDER — ACETAMINOPHEN 650 MG RE SUPP: 650.0000 mg | Freq: Four times a day (QID) | RECTAL | Status: DC | PRN

## 2012-07-23 MED ORDER — MORPHINE SULFATE 4 MG/ML IJ SOLN
4.0000 mg | Freq: Once | INTRAMUSCULAR | Status: AC
  Administered 2012-07-23: 4 mg via INTRAVENOUS
  Filled 2012-07-23: qty 1

## 2012-07-23 MED ORDER — SODIUM CHLORIDE 0.9 % IV SOLN
INTRAVENOUS | Status: DC
  Administered 2012-07-23: 03:00:00 via INTRAVENOUS

## 2012-07-23 MED ORDER — METHIMAZOLE 5 MG PO TABS
5.0000 mg | ORAL_TABLET | Freq: Three times a day (TID) | ORAL | Status: DC
  Administered 2012-07-23 – 2012-07-25 (×5): 5 mg via ORAL
  Filled 2012-07-23 (×7): qty 1

## 2012-07-23 MED ORDER — ENOXAPARIN SODIUM 40 MG/0.4ML ~~LOC~~ SOLN
40.0000 mg | SUBCUTANEOUS | Status: DC
  Administered 2012-07-23 – 2012-07-25 (×3): 40 mg via SUBCUTANEOUS
  Filled 2012-07-23 (×3): qty 0.4

## 2012-07-23 MED ORDER — ONDANSETRON HCL 4 MG/2ML IJ SOLN
4.0000 mg | Freq: Once | INTRAMUSCULAR | Status: AC
  Administered 2012-07-23: 4 mg via INTRAVENOUS
  Filled 2012-07-23: qty 2

## 2012-07-23 MED ORDER — IOHEXOL 350 MG/ML SOLN
100.0000 mL | Freq: Once | INTRAVENOUS | Status: AC | PRN
  Administered 2012-07-23: 100 mL via INTRAVENOUS

## 2012-07-23 MED ORDER — POTASSIUM CHLORIDE IN NACL 20-0.9 MEQ/L-% IV SOLN
INTRAVENOUS | Status: AC
  Administered 2012-07-23: 09:00:00 via INTRAVENOUS
  Filled 2012-07-23 (×2): qty 1000

## 2012-07-23 MED ORDER — ONDANSETRON HCL 4 MG/2ML IJ SOLN: 4.0000 mg | Freq: Four times a day (QID) | INTRAMUSCULAR | Status: DC | PRN

## 2012-07-23 NOTE — Progress Notes (Signed)
[facility] PROGRESS NOTE  ALMYRA BIRMAN UJW:[id number] DOB: 1970-10-06 DOA: 07/22/2012 PCP: No PCP Per Patient bRIEF HPI:  Angel Kemp is a 42 y.o. female with known history of hyperthyroidism and anterior mediastinal mass who was admitted last year for hemoptysis and at that time patient had bronchoscopy which was negative presents with complaints of chest pain. Patient was discharged last time on Tapazole and propranolol and patient did followup with endocrinologist in October. But patient has not been taking her medications recently. In the ER patient was found to be tachycardic. EKG shows sinus tachycardia. Cardiac markers were negative. CT angiogram of the chest is negative for pulmonary embolism and does show the anterior mediastinal mass. Patient at this time has been admitted for further management. She is currently chest pain free, and her cardiac enzymes are negative.    Assessment/Plan: Chest pain with tachycardia - patient's chest pain is atypical. I think most of patient's symptoms are probably from hyperthyroidism. Patient has not been taking her medications as advised previously during her last discharge. At this time I have started patient on atenolol 25 mg by mouth daily and patient probably will need to be started on methimazole once third function test results are back  . And her transaminases are within normal limits. Her alk phos is elevated.  Anterior mediastinal mass - appears stable in CT chest. Followup with cardiothoracic surgery as outpatient.  Mild leukocytosis - probably from #1. Closely follow CBC with differentials. ua is negative and CXR is neg for pneumonia.      Code Status: full code Family Communication: none at bedside Disposition Plan: pending.    Consultants:  none  Procedures:  CTA ANGIO negative for PE.   Antibiotics:  NONE  HPI/Subjective: Denies chest pain at this time.   Objective: Filed Vitals:   07/23/12 0521 07/23/12  0617 07/23/12 0655 07/23/12 1400  BP: 122/70 108/56 119/70 115/66  Pulse: 98 92 84 89  Temp:   97.7 F (36.5 C) 98 F (36.7 C)  TempSrc:   Oral Oral  Resp:  14 18 16   Height:   5\' 3"  (1.6 m)   Weight:   74.345 kg (163 lb 14.4 oz)   SpO2: 100% 99% 100% 100%    Intake/Output Summary (Last 24 hours) at 07/23/12 1544 Last data filed at 07/23/12 1500  Gross per 24 hour  Intake   1720 ml  Output      0 ml  Net   1720 ml   Filed Weights   07/22/12 2235 07/23/12 0655  Weight: 68.04 kg (150 lb) 74.345 kg (163 lb 14.4 oz)    Exam:   General:  Alert afebrile comfortable  Cardiovascular: S1S2  Respiratory: CTAB  Abdomen: soft NT ND BS+  Musculoskeletal: no pedal edema cyanosis or clubbing.   Data Reviewed: Basic Metabolic Panel:  Recent Labs Lab 07/22/12 2236 07/23/12 0945  NA 141 141  K 3.3* 3.8  CL 108 111  CO2 24 22  GLUCOSE 116* 91  BUN 9 7  CREATININE 0.45* 0.43*  CALCIUM 9.5 9.4   Liver Function Tests:  Recent Labs Lab 07/23/12 0945  AST 13  ALT 11  ALKPHOS 146*  BILITOT 0.5  PROT 6.9  ALBUMIN 3.4*   No results found for this basename: LIPASE, AMYLASE,  in the last 168 hours No results found for this basename: AMMONIA,  in the last 168 hours CBC:  Recent Labs Lab 07/22/12 2236 07/23/12 0945  WBC 12.9* 10.6*  NEUTROABS  --  4.1  HGB 12.0 11.5*  HCT 34.4* 33.5*  MCV 82.5 83.1  PLT 293 282   Cardiac Enzymes:  Recent Labs Lab 07/23/12 0945 07/23/12 1045  TROPONINI <0.30 <0.30   BNP (last 3 results)  Recent Labs  02/09/12 1926  PROBNP <5.0   CBG: No results found for this basename: GLUCAP,  in the last 168 hours  No results found for this or any previous visit (from the past 240 hour(s)).   Studies: Ct Angio Chest Pe W/cm &/or Wo Cm  07/23/2012   *RADIOLOGY REPORT*  Clinical Data: Pain and shortness of breath.  CT ANGIOGRAPHY CHEST  Technique:  Multidetector CT imaging of the chest using the standard protocol during bolus  administration of intravenous contrast. Multiplanar reconstructed images including MIPs were obtained and reviewed to evaluate the vascular anatomy.  Contrast: OMNIPAQUE IOHEXOL 350 MG/ML SOLN  Comparison: Chest c t 09/21/2011.  Findings:  Mediastinum: There are no filling defects within the pulmonary arterial tree to suggest underlying pulmonary embolism.  As with the prior examination, there is a small amount of soft tissue in the anterior mediastinum which is favored to represent residual thymic tissue (unusual in a patient of this age).  Thyroid gland is mildly heterogeneous in appearance and enlarged, but similar to the prior study, likely representing goiter. No pathologically enlarged mediastinal or hilar lymph nodes. Esophagus is unremarkable in appearance. Heart size is normal. There is no significant pericardial fluid, thickening or pericardial calcification.  Lungs/Pleura: No acute consolidative airspace disease.  No pneumothorax.  No pleural effusions.  No suspicious appearing pulmonary nodules or masses are identified.  Upper Abdomen: 1.5 cm low attenuation lesion in segment 2 of the liver is similar to the prior study, most with a cyst.  Musculoskeletal: There are no aggressive appearing lytic or blastic lesions noted in the visualized portions of the skeleton.  IMPRESSION: 1.  No evidence of pulmonary embolism. 2.  No acute findings in the thorax to account for the patient's symptoms. 3.  Amorphous soft tissue within the anterior mediastinum is unchanged compared to the prior study.  This may represent thymic hyperplasia or less likely a thymoma.  Clinical correlation for signs and symptoms of myasthenia gravis is suggested.  Given the stability in size and appearance compared to the prior examination, an aggressive lesion is not favored.   Original Report Authenticated By: Trudie Reed, M.D.    Scheduled Meds: . atenolol  25 mg Oral Daily  . enoxaparin (LOVENOX) injection  40 mg  Subcutaneous Q24H  . sodium chloride  3 mL Intravenous Q12H   Continuous Infusions: . 0.9 % NaCl with KCl 20 mEq / L 75 mL/hr at 07/23/12 0915    Principal Problem:   Chest pain Active Problems:   Mediastinal mass   Hyperthyroidism    Time spent: 25 MIN    Dresden Ament  [facility] Pager [phone]. If 7PM-7AM, please contact night-coverage at [email or web address], password [facility] 07/23/2012, 3:44 PM  LOS: 1 day

## 2012-07-23 NOTE — ED Provider Notes (Signed)
History     CSN: [id number]  Arrival date & time 07/22/12  2223   First MD Initiated Contact with Patient 07/23/12 0024      Chief Complaint  Patient presents with  . Chest Pain  . 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 She checked her BP, found it was elevated and her HR was elevated so she presents here, has some associated SOB. No F/C, no trauma, is noncompliant with BP meds, does not have a PCP. No recent surgery or travel.  Pain worse with cough not movemebnt or deep inspiration. Mod to severe pain  Past Medical History  Diagnosis Date  . No pertinent past medical history   . Family history of anesthesia complication     mother  . Cough with hemoptysis 09/21/2011  . Mass in chest 09/21/2011  . Allergy   . Goiter     Past Surgical History  Procedure Laterality Date  . Tubal ligation    . Video bronchoscopy  09/23/2011    Procedure: VIDEO BRONCHOSCOPY WITHOUT FLUORO;  Surgeon: Lonia Farber, MD;  Location: [facility] ENDOSCOPY;  Service: Cardiopulmonary;  Laterality: Bilateral;    Family History  Problem Relation Age of Onset  . Hypertension Mother   . Diabetes type II Mother     History  Substance Use Topics  . Smoking status: Former Smoker    Quit date: 07/07/2011  . Smokeless tobacco: Never Used  . Alcohol Use: No    OB History   Grav Para Term Preterm Abortions TAB SAB Ect Mult Living                  Review of Systems  Constitutional: Negative for fever and chills.  HENT: Negative for neck pain and neck stiffness.   Eyes: Negative for pain.  Respiratory: Positive for cough and shortness of breath.        Cough unchanged for the last 11 months  Cardiovascular: Positive for chest pain. Negative for palpitations and leg swelling.  Gastrointestinal: Negative for abdominal pain.   Genitourinary: Negative for dysuria.  Musculoskeletal: Negative for back pain.  Skin: Negative for rash.  Neurological: Negative for headaches.  All other systems reviewed and are negative.    Allergies  Review of patient's allergies indicates no known allergies.  Home Medications  No current outpatient prescriptions on file.  BP 149/84  Pulse 110  Temp(Src) 98.6 F (37 C) (Oral)  Resp 20  Ht 5\' 3"  (1.6 m)  Wt 150 lb (68.04 kg)  BMI 26.58 kg/m2  SpO2 100%  Physical Exam  Constitutional: She is oriented to person, place, and time. She appears well-developed and well-nourished.  HENT:  Head: Normocephalic and atraumatic.  Eyes: Conjunctivae and EOM are normal. Pupils are equal, round, and reactive to light.  Neck: Trachea normal. Neck supple. No thyromegaly present.  Cardiovascular: Normal rate, regular rhythm, S1 normal, S2 normal and normal pulses.     No systolic murmur is present   No diastolic murmur is present  Pulses:      Radial pulses are 2+ on the right side, and 2+ on the left side.  Pulmonary/Chest: Effort normal and breath sounds normal. She has no wheezes. She has no rhonchi. She has no rales. She exhibits no tenderness.  Abdominal: Soft. Normal appearance and bowel sounds are normal. There is no tenderness. There is no CVA tenderness and  negative Murphy's sign.  Musculoskeletal:  calves nontender, no cords or erythema, negative Homans sign  Neurological: She is alert and oriented to person, place, and time. She has normal strength. No cranial nerve deficit or sensory deficit. GCS eye subscore is 4. GCS verbal subscore is 5. GCS motor subscore is 6.  Skin: Skin is warm and dry. No rash noted. She is not diaphoretic.  Psychiatric: Her speech is normal.  Cooperative and appropriate    ED Course  Procedures (including critical care time)  Results for orders placed during the hospital encounter of 07/22/12  CBC      Result Value Range   WBC 12.9 (*) 4.0 -  10.5 K/uL   RBC 4.17  3.87 - 5.11 MIL/uL   Hemoglobin 12.0  12.0 - 15.0 g/dL   HCT 98.1 (*) 19.1 - 47.8 %   MCV 82.5  78.0 - 100.0 fL   MCH 28.8  26.0 - 34.0 pg   MCHC 34.9  30.0 - 36.0 g/dL   RDW 29.5  62.1 - 30.8 %   Platelets 293  150 - 400 K/uL  BASIC METABOLIC PANEL      Result Value Range   Sodium 141  135 - 145 mEq/L   Potassium 3.3 (*) 3.5 - 5.1 mEq/L   Chloride 108  96 - 112 mEq/L   CO2 24  19 - 32 mEq/L   Glucose, Bld 116 (*) 70 - 99 mg/dL   BUN 9  6 - 23 mg/dL   Creatinine, Ser 6.57 (*) 0.50 - 1.10 mg/dL   Calcium 9.5  8.4 - 84.6 mg/dL   GFR calc non Af Amer >90  >90 mL/min   GFR calc Af Amer >90  >90 mL/min  POCT I-STAT TROPONIN I      Result Value Range   Troponin i, poc 0.01  0.00 - 0.08 ng/mL   Comment 3            Ct Angio Chest Pe W/cm &/or Wo Cm  07/23/2012   *RADIOLOGY REPORT*  Clinical Data: Pain and shortness of breath.  CT ANGIOGRAPHY CHEST  Technique:  Multidetector CT imaging of the chest using the standard protocol during bolus administration of intravenous contrast. Multiplanar reconstructed images including MIPs were obtained and reviewed to evaluate the vascular anatomy.  Contrast: OMNIPAQUE IOHEXOL 350 MG/ML SOLN  Comparison: Chest c t 09/21/2011.  Findings:  Mediastinum: There are no filling defects within the pulmonary arterial tree to suggest underlying pulmonary embolism.  As with the prior examination, there is a small amount of soft tissue in the anterior mediastinum which is favored to represent residual thymic tissue (unusual in a patient of this age).  Thyroid gland is mildly heterogeneous in appearance and enlarged, but similar to the prior study, likely representing goiter. No pathologically enlarged mediastinal or hilar lymph nodes. Esophagus is unremarkable in appearance. Heart size is normal. There is no significant pericardial fluid, thickening or pericardial calcification.  Lungs/Pleura: No acute consolidative airspace disease.  No  pneumothorax.  No pleural effusions.  No suspicious appearing pulmonary nodules or masses are identified.  Upper Abdomen: 1.5 cm low attenuation lesion in segment 2 of the liver is similar to the prior study, most with a cyst.  Musculoskeletal: There are no aggressive appearing lytic or blastic lesions noted in the visualized portions of the skeleton.  IMPRESSION: 1.  No evidence of pulmonary embolism. 2.  No acute findings in the thorax to account for the patient's symptoms. 3.  Amorphous soft tissue within the anterior mediastinum is unchanged compared to the prior study.  This may represent thymic hyperplasia or less likely a thymoma.  Clinical correlation for signs and symptoms of myasthenia gravis is suggested.  Given the stability in size and appearance compared to the prior examination, an aggressive lesion is not favored.   Original Report Authenticated By: Trudie Reed, M.D.     Date: 07/23/2012  Rate: 112  Rhythm: sinus tachycardia  QRS Axis: normal  Intervals: normal  ST/T Wave abnormalities: nonspecific ST changes  Conduction Disutrbances:none  Narrative Interpretation: ST with pause  Old EKG Reviewed: none available   IVFs, IV morphine, zofran and potassium provided  3:01 AM persistent tachycardia, chest mass, MED consult plan admit, free T3/ T4, TSH ordered, cont IVFs  MDM  CP h/o chest mass, tachycardia, HTN ECG, labs, CT PE study reviewed as above MED admit persistent tachycardia        Sunnie Nielsen, MD 07/23/12 [phone]

## 2012-07-23 NOTE — ED Notes (Signed)
ATTEMPTED TO GIVE REPORT RECEIVING NURSE UNAVAILABLE AT THIS TIME .

## 2012-07-23 NOTE — ED Notes (Signed)
Pt in CT.

## 2012-07-23 NOTE — H&P (Signed)
[facility] History and Physical  Angel Kemp ZOX:[id number] DOB: 30-Oct-1970 DOA: 07/22/2012  Referring physician: ER physician. PCP: No PCP Per Patient   Chief Complaint: Chest pain.  HPI: Angel Kemp is a 42 y.o. female with known history of hyperthyroidism and anterior mediastinal mass who was admitted last year for hemoptysis and at that time patient had bronchoscopy which was negative presents with complaints of chest pain. Patient's symptoms are night before this and the pain was most of the right side of the chest which was pin pricking in sensation and pleuritic. Patient states that she has chronic cough. Denies any fever chills. Her chronic cough also makes her to vomit at times. Patient was discharged last time on Tapazole and propranolol and patient did followup with endocrinologist in October. But patient has not been taking her medications recently. In the ER patient was found to be tachycardic. EKG shows sinus tachycardia. Cardiac markers were negative. CT angiogram of the chest is negative for pulmonary embolism and does show the anterior mediastinal mass. Patient at this time has been admitted for further management.  Review of Systems: As presented in the history of presenting illness, rest negative.  Past Medical History  Diagnosis Date  . No pertinent past medical history   . Family history of anesthesia complication     mother  . Cough with hemoptysis 09/21/2011  . Mass in chest 09/21/2011  . Allergy   . Goiter    Past Surgical History  Procedure Laterality Date  . Tubal ligation    . Video bronchoscopy  09/23/2011    Procedure: VIDEO BRONCHOSCOPY WITHOUT FLUORO;  Surgeon: Lonia Farber, MD;  Location: [facility] ENDOSCOPY;  Service: Cardiopulmonary;  Laterality: Bilateral;   Social History:  reports that she quit smoking about 12 months ago. She has never used smokeless tobacco. She reports that she does not drink alcohol or use illicit drugs. Home.  where does patient live-- Can do ADLs. Can patient participate in ADLs?  No Known Allergies  Family History  Problem Relation Age of Onset  . Hypertension Mother   . Diabetes type II Mother       Prior to Admission medications   Not on File   Physical Exam: Filed Vitals:   07/23/12 0330 07/23/12 0345 07/23/12 0400 07/23/12 0415  BP: 112/64 125/89 109/74 104/56  Pulse: 110 110 112 104  Temp:      TempSrc:      Resp: 27 24 24 24   Height:      Weight:      SpO2: 98% 100% 100% 100%     General:  Well-developed well-nourished.  Eyes: Anicteric no pallor.  ENT: No discharge from ears eyes nose mouth.  Neck: Goiter present.  Cardiovascular: S1-S2 heard. Tachycardic.  Respiratory: No rhonchi or crepitations.  Abdomen: Soft nontender bowel sounds present.  Skin: No rash.  Musculoskeletal: No edema.  Psychiatric: Appears normal.  Neurologic: Her e oriented to time place and person. Moves all extremities.  Labs on Admission:  Basic Metabolic Panel:  Recent Labs Lab 07/22/12 2236  NA 141  K 3.3*  CL 108  CO2 24  GLUCOSE 116*  BUN 9  CREATININE 0.45*  CALCIUM 9.5   Liver Function Tests: No results found for this basename: AST, ALT, ALKPHOS, BILITOT, PROT, ALBUMIN,  in the last 168 hours No results found for this basename: LIPASE, AMYLASE,  in the last 168 hours No results found for this basename: AMMONIA,  in the last 168 hours  CBC:  Recent Labs Lab 07/22/12 2236  WBC 12.9*  HGB 12.0  HCT 34.4*  MCV 82.5  PLT 293   Cardiac Enzymes: No results found for this basename: CKTOTAL, CKMB, CKMBINDEX, TROPONINI,  in the last 168 hours  BNP (last 3 results)  Recent Labs  02/09/12 1926  PROBNP <5.0   CBG: No results found for this basename: GLUCAP,  in the last 168 hours  Radiological Exams on Admission: Ct Angio Chest Pe W/cm &/or Wo Cm  07/23/2012   *RADIOLOGY REPORT*  Clinical Data: Pain and shortness of breath.  CT ANGIOGRAPHY CHEST   Technique:  Multidetector CT imaging of the chest using the standard protocol during bolus administration of intravenous contrast. Multiplanar reconstructed images including MIPs were obtained and reviewed to evaluate the vascular anatomy.  Contrast: OMNIPAQUE IOHEXOL 350 MG/ML SOLN  Comparison: Chest c t 09/21/2011.  Findings:  Mediastinum: There are no filling defects within the pulmonary arterial tree to suggest underlying pulmonary embolism.  As with the prior examination, there is a small amount of soft tissue in the anterior mediastinum which is favored to represent residual thymic tissue (unusual in a patient of this age).  Thyroid gland is mildly heterogeneous in appearance and enlarged, but similar to the prior study, likely representing goiter. No pathologically enlarged mediastinal or hilar lymph nodes. Esophagus is unremarkable in appearance. Heart size is normal. There is no significant pericardial fluid, thickening or pericardial calcification.  Lungs/Pleura: No acute consolidative airspace disease.  No pneumothorax.  No pleural effusions.  No suspicious appearing pulmonary nodules or masses are identified.  Upper Abdomen: 1.5 cm low attenuation lesion in segment 2 of the liver is similar to the prior study, most with a cyst.  Musculoskeletal: There are no aggressive appearing lytic or blastic lesions noted in the visualized portions of the skeleton.  IMPRESSION: 1.  No evidence of pulmonary embolism. 2.  No acute findings in the thorax to account for the patient's symptoms. 3.  Amorphous soft tissue within the anterior mediastinum is unchanged compared to the prior study.  This may represent thymic hyperplasia or less likely a thymoma.  Clinical correlation for signs and symptoms of myasthenia gravis is suggested.  Given the stability in size and appearance compared to the prior examination, an aggressive lesion is not favored.   Original Report Authenticated By: Trudie Reed, M.D.    EKG:  Independently reviewed. Sinus tachycardia.  Assessment/Plan Principal Problem:   Chest pain Active Problems:   Mediastinal mass   Hyperthyroidism   1. Chest pain with tachycardia - patient's chest pain is atypical. I think most of patient's symptoms are probably from hyperthyroidism. Patient has not been taking her medications as advised previously during her last discharge. At this time I have started patient on atenolol 25 mg by mouth daily and patient probably will need to be started on methimazole once third function test results are back and baseline CBC with differential and LFTs are available. 2. Anterior mediastinal mass - appears stable in CT chest. Followup with cardiothoracic surgery. 3. Mild leukocytosis - probably from #1. Closely follow CBC with differentials.    Code Status: Full code.  Family Communication: None.  Disposition Plan: Admit to inpatient.    Ellison Rieth N. [facility] Pager [phone].  If 7PM-7AM, please contact night-coverage [email or web address] Password [facility] 07/23/2012, 4:33 AM

## 2012-07-23 NOTE — ED Notes (Signed)
Admitting MD at bedside.

## 2012-07-24 ENCOUNTER — Encounter (HOSPITAL_COMMUNITY): Payer: Self-pay | Admitting: General Practice

## 2012-07-24 ENCOUNTER — Inpatient Hospital Stay (HOSPITAL_COMMUNITY): Payer: BC Managed Care – PPO

## 2012-07-24 DIAGNOSIS — E041 Nontoxic single thyroid nodule: Secondary | ICD-10-CM

## 2012-07-24 NOTE — Progress Notes (Signed)
UR Completed Myrel Rappleye Graves-Bigelow, RN,BSN [phone]  

## 2012-07-24 NOTE — Care Management Note (Signed)
    Page 1 of 1   07/24/2012     10:53:33 AM   CARE MANAGEMENT NOTE 07/24/2012  Patient:  Angel Kemp, Angel Kemp   Account Number:  [id number]  Date Initiated:  07/24/2012  Documentation initiated by:  GRAVES-BIGELOW,Arliss Hepburn  Subjective/Objective Assessment:   Pt admitted with cp. Cardiac markers were negative. CT angiogram of the chest is negative for pulmonary embolism and does show the anterior mediastinal mass. MD Blake Divine plans to consult pulmonary.     Action/Plan:   CM did provide pt with a list of clinics in [place] for PCP. Pt to call and set up appointment. Pt states she just started a new job.   Anticipated DC Date:  07/25/2012   Anticipated DC Plan:  HOME/SELF CARE      DC Planning Services  CM consult      Choice offered to / List presented to:             Status of service:  Completed, signed off Medicare Important Message given?   (If response is "NO", the following Medicare IM given date fields will be blank) Date Medicare IM given:   Date Additional Medicare IM given:    Discharge Disposition:  HOME/SELF CARE  Per UR Regulation:  Reviewed for med. necessity/level of care/duration of stay  If discussed at Long Length of Stay Meetings, dates discussed:    Comments:

## 2012-07-24 NOTE — Progress Notes (Signed)
[facility] PROGRESS NOTE  Angel Kemp DGU:[id number] DOB: 05/07/1970 DOA: 07/22/2012 PCP: No PCP Per Patient bRIEF HPI:  Angel Kemp is a 42 y.o. female with known history of hyperthyroidism and anterior mediastinal mass who was admitted last year for hemoptysis and at that time patient had bronchoscopy which was negative presents with complaints of chest pain. Patient was discharged last time on Tapazole and propranolol and patient did followup with endocrinologist in October. But patient has not been taking her medications recently. In the ER patient was found to be tachycardic. EKG shows sinus tachycardia. Cardiac markers were negative. CT angiogram of the chest is negative for pulmonary embolism and does show the anterior mediastinal mass. Patient at this time has been admitted for further management. She is currently chest pain free, and her cardiac enzymes are negative.    Assessment/Plan: 1. Chest pain with tachycardia -  Resolved. patient's chest pain is atypical. I think most of patient's symptoms are probably from hyperthyroidism. Patient has not been taking her medications as advised previously during her last discharge. At this time I have started patient on atenolol 25 mg by mouth daily and started on methimazole once third function test results are back  . And her transaminases are within normal limits. Her alk phos is elevated.  2. Anterior mediastinal mass - appears stable in CT chest. Followup with cardiothoracic surgery as outpatient. She has an appointment on Friday.   3.Mild leukocytosis - probably from #1. Closely follow CBC with differentials. ua is negative and CXR is neg for pneumonia.   4. DVT prophylaxis.      Code Status: full code Family Communication: none at bedside Disposition Plan: pending.    Consultants:  none  Procedures:  CTA ANGIO negative for PE.   Antibiotics:  NONE  HPI/Subjective: Denies chest pain at this time.    Objective: Filed Vitals:   07/23/12 1400 07/23/12 2030 07/24/12 0626 07/24/12 1426  BP: 115/66 111/73 119/75 128/87  Pulse: 89 87 84 90  Temp: 98 F (36.7 C) 97.6 F (36.4 C) 98 F (36.7 C) 98.2 F (36.8 C)  TempSrc: Oral Oral Oral Oral  Resp: 16 18 18 18   Height:      Weight:      SpO2: 100% 99% 100% 99%    Intake/Output Summary (Last 24 hours) at 07/24/12 1744 Last data filed at 07/24/12 1600  Gross per 24 hour  Intake    780 ml  Output      0 ml  Net    780 ml   Filed Weights   07/22/12 2235 07/23/12 0655  Weight: 68.04 kg (150 lb) 74.345 kg (163 lb 14.4 oz)    Exam:   General:  Alert afebrile comfortable  Cardiovascular: S1S2  Respiratory: CTAB  Abdomen: soft NT ND BS+  Musculoskeletal: no pedal edema cyanosis or clubbing.   Data Reviewed: Basic Metabolic Panel:  Recent Labs Lab 07/22/12 2236 07/23/12 0945  NA 141 141  K 3.3* 3.8  CL 108 111  CO2 24 22  GLUCOSE 116* 91  BUN 9 7  CREATININE 0.45* 0.43*  CALCIUM 9.5 9.4   Liver Function Tests:  Recent Labs Lab 07/23/12 0945  AST 13  ALT 11  ALKPHOS 146*  BILITOT 0.5  PROT 6.9  ALBUMIN 3.4*   No results found for this basename: LIPASE, AMYLASE,  in the last 168 hours No results found for this basename: AMMONIA,  in the last 168 hours CBC:  Recent Labs  Lab 07/22/12 2236 07/23/12 0945  WBC 12.9* 10.6*  NEUTROABS  --  4.1  HGB 12.0 11.5*  HCT 34.4* 33.5*  MCV 82.5 83.1  PLT 293 282   Cardiac Enzymes:  Recent Labs Lab 07/23/12 0945 07/23/12 1045 07/23/12 1601 07/23/12 2235  TROPONINI <0.30 <0.30 <0.30 <0.30   BNP (last 3 results)  Recent Labs  02/09/12 1926  PROBNP <5.0   CBG: No results found for this basename: GLUCAP,  in the last 168 hours  No results found for this or any previous visit (from the past 240 hour(s)).   Studies: Ct Angio Chest Pe W/cm &/or Wo Cm  07/23/2012   *RADIOLOGY REPORT*  Clinical Data: Pain and shortness of breath.  CT ANGIOGRAPHY  CHEST  Technique:  Multidetector CT imaging of the chest using the standard protocol during bolus administration of intravenous contrast. Multiplanar reconstructed images including MIPs were obtained and reviewed to evaluate the vascular anatomy.  Contrast: OMNIPAQUE IOHEXOL 350 MG/ML SOLN  Comparison: Chest c t 09/21/2011.  Findings:  Mediastinum: There are no filling defects within the pulmonary arterial tree to suggest underlying pulmonary embolism.  As with the prior examination, there is a small amount of soft tissue in the anterior mediastinum which is favored to represent residual thymic tissue (unusual in a patient of this age).  Thyroid gland is mildly heterogeneous in appearance and enlarged, but similar to the prior study, likely representing goiter. No pathologically enlarged mediastinal or hilar lymph nodes. Esophagus is unremarkable in appearance. Heart size is normal. There is no significant pericardial fluid, thickening or pericardial calcification.  Lungs/Pleura: No acute consolidative airspace disease.  No pneumothorax.  No pleural effusions.  No suspicious appearing pulmonary nodules or masses are identified.  Upper Abdomen: 1.5 cm low attenuation lesion in segment 2 of the liver is similar to the prior study, most with a cyst.  Musculoskeletal: There are no aggressive appearing lytic or blastic lesions noted in the visualized portions of the skeleton.  IMPRESSION: 1.  No evidence of pulmonary embolism. 2.  No acute findings in the thorax to account for the patient's symptoms. 3.  Amorphous soft tissue within the anterior mediastinum is unchanged compared to the prior study.  This may represent thymic hyperplasia or less likely a thymoma.  Clinical correlation for signs and symptoms of myasthenia gravis is suggested.  Given the stability in size and appearance compared to the prior examination, an aggressive lesion is not favored.   Original Report Authenticated By: Trudie Reed, M.D.    US Soft Tissue Head/neck  07/24/2012   *RADIOLOGY REPORT*  Clinical Data: Hyperthyroidism and thyroid goiter.  THYROID ULTRASOUND  Technique: Ultrasound examination of the thyroid gland and adjacent soft tissues was performed.  Comparison:  CTA of the chest on 07/23/2012  Findings:  Right thyroid lobe:  5.9 x 2.3 x 3.2 cm Left thyroid lobe:  6.0 x 2.0 x 2.4 cm Isthmus:  0.6 cm  The thyroid gland is moderately enlarged and shows diffuse heterogeneity and nodularity with multiple ill-defined areas of nodularity identified throughout both lobes.  Focal nodules:  The only discretely measurable nodule in the thyroid is in the medial right lobe with a partially solid and partially cystic nodule measuring roughly 1.4 x 1.1 x 1.2 cm.  This has likely benign features.  Lymphadenopathy:  None visualized.  IMPRESSION: Moderate large thyroid gland with heterogeneity and multiple areas of ill-defined nodularity consistent with multinodular thyroid goiter.  The only accurately measurable nodule in the right  lobe has likely benign features and measures 1.4 cm in greatest diameter.   Original Report Authenticated By: Irish Lack, M.D.    Scheduled Meds: . atenolol  25 mg Oral Daily  . enoxaparin (LOVENOX) injection  40 mg Subcutaneous Q24H  . methimazole  5 mg Oral TID  . sodium chloride  3 mL Intravenous Q12H   Continuous Infusions:    Principal Problem:   Chest pain Active Problems:   Mediastinal mass   Hyperthyroidism    Time spent: 25 MIN    [facility]  [facility] Pager [phone]. If 7PM-7AM, please contact night-coverage at [email or web address], password [facility] 07/24/2012, 5:44 PM  LOS: 2 days

## 2012-07-25 MED ORDER — ATENOLOL 25 MG PO TABS: 25.0000 mg | ORAL_TABLET | Freq: Every day | ORAL | Status: DC

## 2012-07-25 MED ORDER — METHIMAZOLE 5 MG PO TABS: 5.0000 mg | ORAL_TABLET | Freq: Three times a day (TID) | ORAL | Status: DC

## 2012-07-25 NOTE — Progress Notes (Addendum)
D/c instructions reviewed with pt. Copy of instructions, scripts and MD note for pt's work as she requested given to pt. Pt d/c'd with belongings with family. Pt d/c'd via wheelchair escorted by unit NT

## 2012-07-25 NOTE — Discharge Summary (Signed)
Physician Discharge Summary  Angel Kemp:[id number] DOB: 1970-07-26 DOA: 07/22/2012  PCP: No PCP Per Patient  Admit date: 07/22/2012 Discharge date: 07/25/2012  Time spent: >30 minutes  Recommendations for Outpatient Follow-up:  1-recheck in 4-6 weeks thyroid function test and adjust medications as needed.  Discharge Diagnoses:  Principal Problem:   Chest pain Active Problems:   Mediastinal mass   Hyperthyroidism hypokalemia  Discharge Condition: stable and improved. Discharge home with instructions to take medications as prescribed and to follow with Dr. Everardo All and cardiothoracic surgery service at discharge.  Diet recommendation: regular diet  Filed Weights   07/22/12 2235 07/23/12 0655  Weight: 68.04 kg (150 lb) 74.345 kg (163 lb 14.4 oz)    History of present illness:  42 y.o. female with known history of hyperthyroidism and anterior mediastinal mass who was admitted last year for hemoptysis and at that time patient had bronchoscopy which was negative. Now presents with complaints of chest pain. Patient's symptoms are atypical and nonspecific and the pain was most of the right side of the chest which was pin pricking in sensation and pleuritic in nature. Patient states that she has chronic cough. Denies any fever/chills. Her chronic cough also makes her to vomit at times, but denies any abd pain and currently any nauseas. Patient was discharged last time on Tapazole and propranolol and patient did followup with endocrinologist (Dr. Everardo All) in October. But patient lost insurance and has not been taking her medications or following with anyone recently. In the ER patient was found to be tachycardic. EKG shows sinus tachycardia. Cardiac markers were negative. CT angiogram of the chest is negative for pulmonary embolism and does show the anterior mediastinal mass to be stable/unchanged. Patient at this time has been admitted for further management.   Hospital Course:  1-Chest  pain with tachycardia: atypical in nature and most likely associated with tachycardia and thyroid disease. Patient restarted on atenolol and methimazole. At discharge she was pain free. CE'z neg X3 and no abnormalities on EKG or telemetry.  2-Hyperthyroidism: TSH, T3 and T4 demonstrated uncontrolled hyperthyroidism. Will restart b-blocker and methimazole. Patient will follow with Dr. Everardo All after discharge.  3-Anterior mediastinal mass: overall stable. Patient encourage to follow with cardithoracic surgery as an outpatient.  4-hypokalemia: repleted and WNL at discharge  *Rest of medical problems remains stable and the plan is to continue current medication regimen.  Procedures:  See below for x-ray reports  Consultations:  none  Discharge Exam: Filed Vitals:   07/24/12 0626 07/24/12 1426 07/24/12 2200 07/25/12 0500  BP: 119/75 128/87 127/74 121/68  Pulse: 84 90 88 75  Temp: 98 F (36.7 C) 98.2 F (36.8 C) 98.3 F (36.8 C) 98.1 F (36.7 C)  TempSrc: Oral Oral    Resp: 18 18 18 18   Height:      Weight:      SpO2: 100% 99% 100% 97%    General: Alert afebrile comfortable  Cardiovascular: S1S2  Respiratory: CTAB  Abdomen: soft NT ND BS+  Musculoskeletal: no pedal edema cyanosis or clubbing.   Discharge Instructions  Discharge Orders   Future Appointments Provider Department Dept Phone   07/27/2012 3:00 PM Delight Ovens, MD [facility] [phone]   Future Orders Complete By Expires     Discharge instructions  As directed     Comments:      Keep yourself hydrated Take medications as prescribed Arrange follow up with Dr. Romero Belling as soon as possible.  Medication List    TAKE these medications       atenolol 25 MG tablet  Commonly known as:  TENORMIN  Take 1 tablet (25 mg total) by mouth daily.     methimazole 5 MG tablet  Commonly known as:  TAPAZOLE  Take 1 tablet (5 mg total) by mouth 3 (three) times  daily.       No Known Allergies     Follow-up Information   Schedule an appointment as soon as possible for a visit with Romero Belling, MD. (Call office and make appointment to be seen as soon as possible)    Contact information:   [address] [place] [address] [phone]       Schedule an appointment as soon as possible for a visit with GERHARDT,EDWARD B, MD. (call office to set up appointment )    Contact information:   [address] [place] [address] [phone]       The results of significant diagnostics from this hospitalization (including imaging, microbiology, ancillary and laboratory) are listed below for reference.    Significant Diagnostic Studies: Ct Angio Chest Pe W/cm &/or Wo Cm  07/23/2012   *RADIOLOGY REPORT*  Clinical Data: Pain and shortness of breath.  CT ANGIOGRAPHY CHEST  Technique:  Multidetector CT imaging of the chest using the standard protocol during bolus administration of intravenous contrast. Multiplanar reconstructed images including MIPs were obtained and reviewed to evaluate the vascular anatomy.  Contrast: OMNIPAQUE IOHEXOL 350 MG/ML SOLN  Comparison: Chest c t 09/21/2011.  Findings:  Mediastinum: There are no filling defects within the pulmonary arterial tree to suggest underlying pulmonary embolism.  As with the prior examination, there is a small amount of soft tissue in the anterior mediastinum which is favored to represent residual thymic tissue (unusual in a patient of this age).  Thyroid gland is mildly heterogeneous in appearance and enlarged, but similar to the prior study, likely representing goiter. No pathologically enlarged mediastinal or hilar lymph nodes. Esophagus is unremarkable in appearance. Heart size is normal. There is no significant pericardial fluid, thickening or pericardial calcification.  Lungs/Pleura: No acute consolidative airspace disease.  No pneumothorax.  No pleural effusions.   No suspicious appearing pulmonary nodules or masses are identified.  Upper Abdomen: 1.5 cm low attenuation lesion in segment 2 of the liver is similar to the prior study, most with a cyst.  Musculoskeletal: There are no aggressive appearing lytic or blastic lesions noted in the visualized portions of the skeleton.  IMPRESSION: 1.  No evidence of pulmonary embolism. 2.  No acute findings in the thorax to account for the patient's symptoms. 3.  Amorphous soft tissue within the anterior mediastinum is unchanged compared to the prior study.  This may represent thymic hyperplasia or less likely a thymoma.  Clinical correlation for signs and symptoms of myasthenia gravis is suggested.  Given the stability in size and appearance compared to the prior examination, an aggressive lesion is not favored.   Original Report Authenticated By: Trudie Reed, M.D.   US Soft Tissue Head/neck  07/24/2012   *RADIOLOGY REPORT*  Clinical Data: Hyperthyroidism and thyroid goiter.  THYROID ULTRASOUND  Technique: Ultrasound examination of the thyroid gland and adjacent soft tissues was performed.  Comparison:  CTA of the chest on 07/23/2012  Findings:  Right thyroid lobe:  5.9 x 2.3 x 3.2 cm Left thyroid lobe:  6.0 x 2.0 x 2.4 cm Isthmus:  0.6 cm  The  thyroid gland is moderately enlarged and shows diffuse heterogeneity and nodularity with multiple ill-defined areas of nodularity identified throughout both lobes.  Focal nodules:  The only discretely measurable nodule in the thyroid is in the medial right lobe with a partially solid and partially cystic nodule measuring roughly 1.4 x 1.1 x 1.2 cm.  This has likely benign features.  Lymphadenopathy:  None visualized.  IMPRESSION: Moderate large thyroid gland with heterogeneity and multiple areas of ill-defined nodularity consistent with multinodular thyroid goiter.  The only accurately measurable nodule in the right lobe has likely benign features and measures 1.4 cm in greatest  diameter.   Original Report Authenticated By: Irish Lack, M.D.   Labs: Basic Metabolic Panel:  Recent Labs Lab 07/22/12 2236 07/23/12 0945  NA 141 141  K 3.3* 3.8  CL 108 111  CO2 24 22  GLUCOSE 116* 91  BUN 9 7  CREATININE 0.45* 0.43*  CALCIUM 9.5 9.4   Liver Function Tests:  Recent Labs Lab 07/23/12 0945  AST 13  ALT 11  ALKPHOS 146*  BILITOT 0.5  PROT 6.9  ALBUMIN 3.4*   CBC:  Recent Labs Lab 07/22/12 2236 07/23/12 0945  WBC 12.9* 10.6*  NEUTROABS  --  4.1  HGB 12.0 11.5*  HCT 34.4* 33.5*  MCV 82.5 83.1  PLT 293 282   Cardiac Enzymes:  Recent Labs Lab 07/23/12 0945 07/23/12 1045 07/23/12 1601 07/23/12 2235  TROPONINI <0.30 <0.30 <0.30 <0.30   BNP: BNP (last 3 results)  Recent Labs  02/09/12 1926  PROBNP <5.0     Signed:  Shaheed Schmuck  [facility] 07/25/2012, 11:16 AM

## 2012-07-27 ENCOUNTER — Encounter: Payer: Self-pay | Admitting: Internal Medicine

## 2012-07-27 ENCOUNTER — Encounter: Payer: Self-pay | Admitting: Cardiothoracic Surgery

## 2012-07-27 ENCOUNTER — Ambulatory Visit (INDEPENDENT_AMBULATORY_CARE_PROVIDER_SITE_OTHER): Payer: Self-pay | Admitting: Cardiothoracic Surgery

## 2012-07-27 VITALS — BP 137/88 | HR 75 | Resp 16 | Ht 63.5 in | Wt 160.0 lb

## 2012-07-27 DIAGNOSIS — E0590 Thyrotoxicosis, unspecified without thyrotoxic crisis or storm: Secondary | ICD-10-CM

## 2012-07-27 DIAGNOSIS — R222 Localized swelling, mass and lump, trunk: Secondary | ICD-10-CM

## 2012-07-27 DIAGNOSIS — J9859 Other diseases of mediastinum, not elsewhere classified: Secondary | ICD-10-CM

## 2012-07-28 NOTE — Progress Notes (Signed)
[address]       [place] [address]             [phone]                    Angel Kemp [facility] Medical Record #[id number] Date of Birth: [birth year]  Referring: Kathlen Mody, MD Primary Care: No PCP Per Patient  Chief Complaint:    Chief Complaint  Patient presents with  . Mediastinal Mass    anterior.Marland KitchenMarland KitchenCTA CHEST/US THYROID.Marland Kitcheneval and treat    History of Present Illness:    Patient is 42 yo female with known history of hyperthyroidism and anterior mediastinal mass who was admitted last year for hemoptysis and at that time patient had bronchoscopy which was negative. Recently admitted  with complaints of chest pain. Patient's symptoms was most of the right side of the chest which was pin pricking in sensation and pleuritic. Patient states that she has chronic cough. Denies any fever chills. Her chronic cough also makes her to vomit at times. Patient was discharged lastadmission e on Tapazole and propranolol and patient did followup with endocrinologist in October. But patient has not been taking her medications recently. In the ER patient was found to be tachycardic. EKG shows sinus tachycardia. Cardiac markers were negative. CT angiogram of the chest is negative for pulmonary embolism and does show the anterior mediastinal mass un changed .   I saw the Patient  8 months ago because incidental finding of mildly enlarged anterior mediastinal tissue.  She missed her follow up appointments with medicine for enlarged thyroid, pulmonary for hemoptysis and did not return to thoracic surgery office . She notes health insurance problems. Recently sent to the ER from work because of increased BP and HR. Follow up Ct of chest is unchanged and she referred back to thoracic surgery. She has no symptoms suggestive of Myasthenthia  Gravis.      Current Activity/ Functional Status:  Patient is independent with mobility/ambulation, transfers, ADL's,  IADL's.  Zubrod Score: At the time of surgery this patient's most appropriate activity status/level should be described as: []  Normal activity, no symptoms [x]  Symptoms, fully ambulatory []  Symptoms, in bed less than or equal to 50% of the time []  Symptoms, in bed greater than 50% of the time but less than 100% []  Bedridden []  Moribund   Past Medical History  Diagnosis Date  . Family history of anesthesia complication     mother  . Cough with hemoptysis 09/21/2011  . Mass in chest 09/21/2011  . Allergy   . Goiter   . Hyperthyroidism   . History of urinary urgency     Past Surgical History  Procedure Laterality Date  . Tubal ligation    . Video bronchoscopy  09/23/2011    Procedure: VIDEO BRONCHOSCOPY WITHOUT FLUORO;  Surgeon: Lonia Farber, MD;  Location: [facility] ENDOSCOPY;  Service: Cardiopulmonary;  Laterality: Bilateral;    Family History  Problem Relation Age of Onset  . Hypertension Mother   . Diabetes type II Mother     History   Social History  . Marital Status: Single    Spouse Name: N/A    Number of Children: N/A  . Years of Education: N/A   Occupational History  . 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 Smoking status: Former Smoker    Quit date: 07/07/2011  . Smokeless tobacco: Never Used  . Alcohol  Use: No  . Drug Use: No  . Sexually Active: Not Currently      History  Smoking status  . Former Smoker  . Quit date: 07/07/2011  Smokeless tobacco  . Never Used    History  Alcohol Use No     No Known Allergies  Current Outpatient Prescriptions  Medication Sig Dispense Refill  . atenolol (TENORMIN) 25 MG tablet Take 1 tablet (25 mg total) by mouth daily.  30 tablet  1  . methimazole (TAPAZOLE) 5 MG tablet Take 1 tablet (5 mg total) by mouth 3 (three) times daily.  90 tablet  1   No current facility-administered medications for this visit.       Review of Systems:     Cardiac Review of Systems: Y or  N  Chest Pain [  y  ]  Resting SOB [   ] Exertional SOB  [ y ]  Orthopnea [  ]   Pedal Edema [   ]    Palpitations [  ] Syncope  [  ]   Presyncope [   ]  General Review of Systems: [Y] = yes [  ]=no Constitional: recent weight change [ n ]; anorexia [  ]; fatigue [ y ]; nausea [  ]; night sweats [  ]; fever [ n ]; or chills [n  ];                                                                                                                                          Dental: poor dentition[  ]; Last Dentist visit:   Eye : blurred vision [  ]; diplopia [   ]; vision changes [  ];  Amaurosis fugax[  ]; Resp: cough Cove.Etienne  ];  wheezing[n  ];  Hemoptysis[not now  ]; shortness of breath[  ]; paroxysmal nocturnal dyspnea[  ]; dyspnea on exertion[  ]; or orthopnea[  ];  GI:  gallstones[  ], vomiting[  ];  dysphagia[  ]; melena[  ];  hematochezia [  ]; heartburn[  ];   Hx of  Colonoscopy[  ]; GU: kidney stones [  ]; hematuria[  ];   dysuria [  ];  nocturia[  ];  history of     obstruction [  ]; urinary frequency [  ]             Skin: rash, swelling[  ];, hair loss[  ];  peripheral edema[  ];  or itching[  ]; Musculosketetal: myalgias[  ];  joint swelling[  ];  joint erythema[  ];  joint pain[  ];  back pain[  ];  Heme/Lymph: bruising[  ];  bleeding[  ];  anemia[  ];  Neuro: TIA[  ];  headaches[  ];  stroke[  ];  vertigo[  ];  seizures[  ];   paresthesias[  ];  difficulty walking[  ];  Psych:depression[  ]; anxiety[  ];  Endocrine: diabetes[  ];  thyroid dysfunction[y  ];  Immunizations: Flu [unknown   ]; Pneumococcal[unknown  ];  Other:  Physical Exam: BP 137/88  Pulse 75  Resp 16  Ht 5' 3.5" (1.613 m)  Wt 160 lb (72.576 kg)  BMI 27.89 kg/m2  SpO2 98%  LMP 07/04/2012  General appearance: alert and cooperative Neurologic: intact, no lid droop Heart: regular rate and rhythm, S1, S2 normal, no murmur, click, rub or gallop Lungs: clear to auscultation bilaterally and normal percussion  bilaterally Abdomen: soft, non-tender; bowel sounds normal; no masses,  no organomegaly Extremities: extremities normal, atraumatic, no cyanosis or edema and Homans sign is negative, no sign of DVT diffusely enlarged thyroid on palpation. No cervical or axillary nodes palpable.   Diagnostic Studies & Laboratory data:     Recent Radiology Findings:  Ct Angio Chest Pe W/cm &/or Wo Cm  07/23/2012   *RADIOLOGY REPORT*  Clinical Data: Pain and shortness of breath.  CT ANGIOGRAPHY CHEST  Technique:  Multidetector CT imaging of the chest using the standard protocol during bolus administration of intravenous contrast. Multiplanar reconstructed images including MIPs were obtained and reviewed to evaluate the vascular anatomy.  Contrast: OMNIPAQUE IOHEXOL 350 MG/ML SOLN  Comparison: Chest c t 09/21/2011.  Findings:  Mediastinum: There are no filling defects within the pulmonary arterial tree to suggest underlying pulmonary embolism.  As with the prior examination, there is a small amount of soft tissue in the anterior mediastinum which is favored to represent residual thymic tissue (unusual in a patient of this age).  Thyroid gland is mildly heterogeneous in appearance and enlarged, but similar to the prior study, likely representing goiter. No pathologically enlarged mediastinal or hilar lymph nodes. Esophagus is unremarkable in appearance. Heart size is normal. There is no significant pericardial fluid, thickening or pericardial calcification.  Lungs/Pleura: No acute consolidative airspace disease.  No pneumothorax.  No pleural effusions.  No suspicious appearing pulmonary nodules or masses are identified.  Upper Abdomen: 1.5 cm low attenuation lesion in segment 2 of the liver is similar to the prior study, most with a cyst.  Musculoskeletal: There are no aggressive appearing lytic or blastic lesions noted in the visualized portions of the skeleton.  IMPRESSION: 1.  No evidence of pulmonary embolism. 2.  No  acute findings in the thorax to account for the patient's symptoms. 3.  Amorphous soft tissue within the anterior mediastinum is unchanged compared to the prior study.  This may represent thymic hyperplasia or less likely a thymoma.  Clinical correlation for signs and symptoms of myasthenia gravis is suggested.  Given the stability in size and appearance compared to the prior examination, an aggressive lesion is not favored.   Original Report Authenticated By: Trudie Reed, M.D.   US Soft Tissue Head/neck  07/24/2012   *RADIOLOGY REPORT*  Clinical Data: Hyperthyroidism and thyroid goiter.  THYROID ULTRASOUND  Technique: Ultrasound examination of the thyroid gland and adjacent soft tissues was performed.  Comparison:  CTA of the chest on 07/23/2012  Findings:  Right thyroid lobe:  5.9 x 2.3 x 3.2 cm Left thyroid lobe:  6.0 x 2.0 x 2.4 cm Isthmus:  0.6 cm  The thyroid gland is moderately enlarged and shows diffuse heterogeneity and nodularity with multiple ill-defined areas of nodularity identified throughout both lobes.  Focal nodules:  The only discretely measurable nodule in the thyroid is in the medial right lobe with a partially solid and partially cystic nodule  measuring roughly 1.4 x 1.1 x 1.2 cm.  This has likely benign features.  Lymphadenopathy:  None visualized.  IMPRESSION: Moderate large thyroid gland with heterogeneity and multiple areas of ill-defined nodularity consistent with multinodular thyroid goiter.  The only accurately measurable nodule in the right lobe has likely benign features and measures 1.4 cm in greatest diameter.   Original Report Authenticated By: Irish Lack, M.D.    Recent Lab Findings: Lab Results  Component Value Date   WBC 10.6* 07/23/2012   HGB 11.5* 07/23/2012   HCT 33.5* 07/23/2012   PLT 282 07/23/2012   GLUCOSE 91 07/23/2012   ALT 11 07/23/2012   AST 13 07/23/2012   NA 141 07/23/2012   K 3.8 07/23/2012   CL 111 07/23/2012   CREATININE 0.43* 07/23/2012   BUN 7  07/23/2012   CO2 22 07/23/2012   TSH 0.010* 07/23/2012   INR 1.01 09/21/2011      Assessment / Plan:      Amorphous soft tissue within the anterior mediastinum- unchanged over 8 months   This may represent thymic hyperplasia or less likely a thymoma Will have follow up ct of chest in 8 months encouraged to keep her appointmets    Delight Ovens MD      [address] [place].[address] [place] [address] Office [phone]   Beeper [phone]  07/29/2012 9:35 AM

## 2012-07-30 ENCOUNTER — Encounter: Payer: Self-pay | Admitting: Endocrinology

## 2012-07-30 ENCOUNTER — Ambulatory Visit (INDEPENDENT_AMBULATORY_CARE_PROVIDER_SITE_OTHER): Payer: Self-pay | Admitting: Endocrinology

## 2012-07-30 VITALS — BP 136/80 | HR 90 | Ht 63.0 in | Wt 150.0 lb

## 2012-07-30 DIAGNOSIS — E0590 Thyrotoxicosis, unspecified without thyrotoxic crisis or storm: Secondary | ICD-10-CM

## 2012-07-30 LAB — T4, FREE: Free T4: 2.92 ng/dL — ABNORMAL HIGH (ref 0.60–1.60)

## 2012-07-30 MED ORDER — METHIMAZOLE 10 MG PO TABS: ORAL_TABLET | ORAL | Status: DC

## 2012-07-30 NOTE — Patient Instructions (Addendum)
blood tests are being requested for you today.  We'll contact you with results. When you get medicaid, you should consider taking the radioactive iodine, to destroy the thyroid.  Until then, you can take the methimazole if ever you have fever while taking methimazole, stop it and call us, because of the risk of a rare side-effect. Please come back for a follow-up appointment for 1 month. In view of your medical condition, you should avoid pregnancy until we have decided it is safe

## 2012-07-30 NOTE — Progress Notes (Signed)
  Subjective:    Patient ID: Angel Kemp, female    DOB: 1970-05-10, 42 y.o.   MRN: [id number]  HPI Pt was in the hospital in 2013 for a chest mass, and was found to have hyperthyroidism.  She was rx'ed tapazole and inderal.  i-131 rx was not pursued, due to her lack of insurance.  She has slight tremor of the hands, and assoc cramps.  She ran out of tapazole 2 months ago.   Past Medical History  Diagnosis Date  . No pertinent past medical history   . Family history of anesthesia complication     mother  . Cough with hemoptysis 09/21/2011  . Mass in chest 09/21/2011  . Allergy   . Goiter   . Hyperthyroidism   . History of urinary urgency     Past Surgical History  Procedure Laterality Date  . Tubal ligation    . Video bronchoscopy  09/23/2011    Procedure: VIDEO BRONCHOSCOPY WITHOUT FLUORO;  Surgeon: Lonia Farber, MD;  Location: [facility] ENDOSCOPY;  Service: Cardiopulmonary;  Laterality: Bilateral;    History   Social History  . Marital Status: Single    Spouse Name: N/A    Number of Children: N/A  . Years of Education: N/A   Occupational History  . Not on file.   Social History Main Topics  . Smoking status: Former Smoker    Quit date: 07/07/2011  . Smokeless tobacco: Never Used  . Alcohol Use: No  . Drug Use: No  . Sexually Active: Not Currently   Other Topics Concern  . Not on file   Social History Narrative  . No narrative on file    Current Outpatient Prescriptions on File Prior to Visit  Medication Sig Dispense Refill  . atenolol (TENORMIN) 25 MG tablet Take 1 tablet (25 mg total) by mouth daily.  30 tablet  1   No current facility-administered medications on file prior to visit.    No Known Allergies  Family History  Problem Relation Age of Onset  . Hypertension Mother   . Diabetes type II Mother     BP 136/80  Pulse 90  Ht 5\' 3"  (1.6 m)  Wt 150 lb (68.04 kg)  BMI 26.58 kg/m2  SpO2 98%  LMP 07/04/2012  Review of Systems Denies  fever and weight change    Objective:   Physical Exam VITAL SIGNS:  See vs page GENERAL: no distress head: no deformity eyes: no periorbital swelling, no proptosis external nose and ears are normal mouth: no lesion seen NECK: 10x normal size thyroid, (R>L).  No discrete nodule Skin: not diaphoretic Neuro: no tremor   Lab Results  Component Value Date   TSH 0.15* 07/30/2012      Assessment & Plan:  Hyperthyroidism.  We discussed rx options.  She chooses to continue tapazole for now, due to lack of insurance.

## 2012-08-29 ENCOUNTER — Encounter: Payer: Self-pay | Admitting: Endocrinology

## 2012-08-29 ENCOUNTER — Ambulatory Visit: Payer: Self-pay | Admitting: Endocrinology

## 2012-09-10 ENCOUNTER — Ambulatory Visit: Payer: Self-pay | Admitting: Internal Medicine

## 2012-09-10 ENCOUNTER — Emergency Department (INDEPENDENT_AMBULATORY_CARE_PROVIDER_SITE_OTHER): Payer: BC Managed Care – PPO

## 2012-09-10 ENCOUNTER — Emergency Department (HOSPITAL_COMMUNITY)
Admission: EM | Admit: 2012-09-10 | Discharge: 2012-09-10 | Disposition: A | Discharge status: Home / Self Care | Payer: BC Managed Care – PPO | Source: Home / Self Care | Attending: Family Medicine | Admitting: Family Medicine

## 2012-09-10 ENCOUNTER — Encounter (HOSPITAL_COMMUNITY): Payer: Self-pay | Admitting: Emergency Medicine

## 2012-09-10 DIAGNOSIS — J209 Acute bronchitis, unspecified: Secondary | ICD-10-CM

## 2012-09-10 DIAGNOSIS — Z0289 Encounter for other administrative examinations: Secondary | ICD-10-CM

## 2012-09-10 MED ORDER — CEFDINIR 300 MG PO CAPS: 300.0000 mg | ORAL_CAPSULE | Freq: Two times a day (BID) | ORAL | Status: DC

## 2012-09-10 NOTE — ED Notes (Signed)
Reports coughing up blood and fever that stated last night with headache. Pt states coughing started on 8/1. Symptoms gradually getting worse.  Ongoing problem since 09/2011 off/on but recently started back up again.  Pt has tried increasing fluids and rest with no relief.

## 2012-09-10 NOTE — ED Provider Notes (Signed)
CSN: [id number]     Arrival date & time 09/10/12  1650 History     First MD Initiated Contact with Patient 09/10/12 1807     Chief Complaint  Patient presents with  . Hemoptysis   (Consider location/radiation/quality/duration/timing/severity/associated sxs/prior Treatment) Patient is a 42 y.o. female presenting with cough. The history is provided by the patient.  Cough Cough characteristics:  Vomit-inducing and productive Sputum characteristics:  Bloody Severity:  Mild Onset quality:  Gradual Duration:  3 days Timing:  Intermittent Chronicity:  Chronic Smoker: no   Associated symptoms: chills, fever and shortness of breath   Risk factors comment:  Sx off and on since dx in 09/2011, followed by dr gerhart.   Past Medical History  Diagnosis Date  . No pertinent past medical history   . Family history of anesthesia complication     mother  . Cough with hemoptysis 09/21/2011  . Mass in chest 09/21/2011  . Allergy   . Goiter   . Hyperthyroidism   . History of urinary urgency    Past Surgical History  Procedure Laterality Date  . Tubal ligation    . Video bronchoscopy  09/23/2011    Procedure: VIDEO BRONCHOSCOPY WITHOUT FLUORO;  Surgeon: Lonia Farber, MD;  Location: [facility] ENDOSCOPY;  Service: Cardiopulmonary;  Laterality: Bilateral;   Family History  Problem Relation Age of Onset  . Hypertension Mother   . Diabetes type II Mother    History  Substance Use Topics  . Smoking status: Former Smoker    Quit date: 07/07/2011  . Smokeless tobacco: Never Used  . Alcohol Use: No   OB History   Grav Para Term Preterm Abortions TAB SAB Ect Mult Living                 Review of Systems  Constitutional: Positive for fever and chills.  HENT: Negative for nosebleeds and congestion.   Respiratory: Positive for cough and shortness of breath. Negative for chest tightness.   Cardiovascular: Negative.   Gastrointestinal: Negative.     Allergies  Review of patient's  allergies indicates no known allergies.  Home Medications   Current Outpatient Rx  Name  Route  Sig  Dispense  Refill  . atenolol (TENORMIN) 25 MG tablet   Oral   Take 1 tablet (25 mg total) by mouth daily.   30 tablet   1   . methimazole (TAPAZOLE) 10 MG tablet      2 tabs, twice a day   120 tablet   2   . cefdinir (OMNICEF) 300 MG capsule   Oral   Take 1 capsule (300 mg total) by mouth 2 (two) times daily.   20 capsule   0    BP 142/72  Pulse 61  Temp(Src) 98.6 F (37 C)  Resp 20  SpO2 100%  LMP 09/05/2012 Physical Exam  Nursing note and vitals reviewed. Constitutional: She is oriented to person, place, and time. She appears well-developed and well-nourished.  HENT:  Mouth/Throat: Oropharynx is clear and moist.  Eyes: Conjunctivae are normal. Pupils are equal, round, and reactive to light.  Neck: Normal range of motion. Neck supple. Thyromegaly present.  Cardiovascular: Normal rate, regular rhythm, normal heart sounds and intact distal pulses.   Pulmonary/Chest: Effort normal and breath sounds normal.  Abdominal: Soft. Bowel sounds are normal.  Neurological: She is alert and oriented to person, place, and time.  Skin: Skin is warm and dry.    ED Course   Procedures (including critical care  time)  Labs Reviewed - No data to display Dg Chest 2 View  09/10/2012   *RADIOLOGY REPORT*  Clinical Data: Fever, hemoptysis  CHEST - 2 VIEW  Comparison: 02/09/2012, 07/23/2012  Findings: Borderline heart size but normal vascularity.  Clear lungs.  No pneumonia, edema, collapse or consolidation.  No effusion or pneumothorax.  Trachea is midline peri borderline heart size.  No acute chest process.  IMPRESSION: Borderline heart size.  No acute process.   Original Report Authenticated By: Judie Petit. Miles Costain, M.D.   1. Bronchitis, acute     MDM  X-rays reviewed and report per radiologist.   Linna Hoff, MD 09/10/12 Ernestina [place]

## 2012-09-24 ENCOUNTER — Other Ambulatory Visit: Payer: Self-pay

## 2012-09-24 MED ORDER — ATENOLOL 25 MG PO TABS: 25.0000 mg | ORAL_TABLET | Freq: Every day | ORAL | Status: DC

## 2012-09-24 MED ORDER — METHIMAZOLE 10 MG PO TABS: ORAL_TABLET | ORAL | Status: DC

## 2012-09-25 ENCOUNTER — Other Ambulatory Visit: Payer: Self-pay | Admitting: Endocrinology

## 2012-11-19 ENCOUNTER — Other Ambulatory Visit: Payer: Self-pay | Admitting: Endocrinology

## 2012-11-26 ENCOUNTER — Other Ambulatory Visit: Payer: Self-pay | Admitting: Endocrinology

## 2012-11-27 ENCOUNTER — Other Ambulatory Visit: Payer: Self-pay | Admitting: *Deleted

## 2012-11-27 MED ORDER — METHIMAZOLE 5 MG PO TABS: 5.0000 mg | ORAL_TABLET | Freq: Three times a day (TID) | ORAL | Status: DC

## 2012-12-13 ENCOUNTER — Other Ambulatory Visit: Payer: Self-pay

## 2012-12-28 ENCOUNTER — Other Ambulatory Visit: Payer: Self-pay | Admitting: Endocrinology

## 2013-01-09 ENCOUNTER — Ambulatory Visit
Admission: RE | Admit: 2013-01-09 | Discharge: 2013-01-09 | Disposition: A | Discharge status: Home / Self Care | Payer: BC Managed Care – PPO | Source: Ambulatory Visit | Attending: Family | Admitting: Family

## 2013-01-09 ENCOUNTER — Other Ambulatory Visit: Payer: Self-pay | Admitting: Family

## 2013-01-09 DIAGNOSIS — R042 Hemoptysis: Secondary | ICD-10-CM

## 2013-01-09 DIAGNOSIS — R0602 Shortness of breath: Secondary | ICD-10-CM

## 2013-01-19 ENCOUNTER — Other Ambulatory Visit: Payer: Self-pay | Admitting: Endocrinology

## 2013-02-12 ENCOUNTER — Other Ambulatory Visit: Payer: Self-pay

## 2013-02-12 DIAGNOSIS — R222 Localized swelling, mass and lump, trunk: Secondary | ICD-10-CM

## 2013-02-13 ENCOUNTER — Other Ambulatory Visit: Payer: Self-pay | Admitting: Internal Medicine

## 2013-02-13 DIAGNOSIS — N6310 Unspecified lump in the right breast, unspecified quadrant: Principal | ICD-10-CM

## 2013-02-13 DIAGNOSIS — N6315 Unspecified lump in the right breast, overlapping quadrants: Secondary | ICD-10-CM

## 2013-02-20 ENCOUNTER — Ambulatory Visit
Admission: RE | Admit: 2013-02-20 | Discharge: 2013-02-20 | Disposition: A | Discharge status: Home / Self Care | Payer: BC Managed Care – PPO | Source: Ambulatory Visit | Attending: Internal Medicine | Admitting: Internal Medicine

## 2013-02-20 DIAGNOSIS — N6315 Unspecified lump in the right breast, overlapping quadrants: Secondary | ICD-10-CM

## 2013-02-20 DIAGNOSIS — N6310 Unspecified lump in the right breast, unspecified quadrant: Principal | ICD-10-CM

## 2013-03-28 ENCOUNTER — Other Ambulatory Visit: Payer: BC Managed Care – PPO

## 2013-03-28 ENCOUNTER — Ambulatory Visit: Payer: BC Managed Care – PPO | Admitting: Cardiothoracic Surgery

## 2013-03-28 ENCOUNTER — Telehealth: Payer: Self-pay

## 2013-03-28 NOTE — Telephone Encounter (Signed)
Pt was contacted regarding missed CT Chest appt 03/28/13.  Pt did not go due to high out of pocket expense due to insurance.  Payment arrangements were offered with [facility].  Pt did not want to reschedule appt at this time due to expenses and work schedule.  She will call back at a later date to reschedule.

## 2013-03-29 ENCOUNTER — Other Ambulatory Visit: Payer: BC Managed Care – PPO

## 2013-04-01 ENCOUNTER — Ambulatory Visit: Payer: BC Managed Care – PPO | Admitting: Cardiothoracic Surgery

## 2013-04-11 ENCOUNTER — Other Ambulatory Visit: Payer: Self-pay | Admitting: Endocrinology

## 2013-05-28 ENCOUNTER — Emergency Department (HOSPITAL_COMMUNITY)
Admission: EM | Admit: 2013-05-28 | Discharge: 2013-05-29 | Disposition: A | Discharge status: Home / Self Care | Payer: BC Managed Care – PPO | Attending: Emergency Medicine | Admitting: Emergency Medicine

## 2013-05-28 ENCOUNTER — Encounter (HOSPITAL_COMMUNITY): Payer: Self-pay | Admitting: Emergency Medicine

## 2013-05-28 DIAGNOSIS — Z87891 Personal history of nicotine dependence: Secondary | ICD-10-CM | POA: Insufficient documentation

## 2013-05-28 DIAGNOSIS — S8000XA Contusion of unspecified knee, initial encounter: Secondary | ICD-10-CM | POA: Insufficient documentation

## 2013-05-28 DIAGNOSIS — Y9289 Other specified places as the place of occurrence of the external cause: Secondary | ICD-10-CM | POA: Insufficient documentation

## 2013-05-28 DIAGNOSIS — Z79899 Other long term (current) drug therapy: Secondary | ICD-10-CM | POA: Insufficient documentation

## 2013-05-28 DIAGNOSIS — Y990 Civilian activity done for income or pay: Secondary | ICD-10-CM | POA: Insufficient documentation

## 2013-05-28 DIAGNOSIS — Z792 Long term (current) use of antibiotics: Secondary | ICD-10-CM | POA: Insufficient documentation

## 2013-05-28 DIAGNOSIS — W2209XA Striking against other stationary object, initial encounter: Secondary | ICD-10-CM | POA: Insufficient documentation

## 2013-05-28 DIAGNOSIS — Y9302 Activity, running: Secondary | ICD-10-CM | POA: Insufficient documentation

## 2013-05-28 DIAGNOSIS — E049 Nontoxic goiter, unspecified: Secondary | ICD-10-CM | POA: Insufficient documentation

## 2013-05-28 DIAGNOSIS — E0590 Thyrotoxicosis, unspecified without thyrotoxic crisis or storm: Secondary | ICD-10-CM | POA: Insufficient documentation

## 2013-05-28 NOTE — ED Provider Notes (Signed)
CSN: [id number]     Arrival date & time 05/28/13  2330 History  This chart was scribed for non-physician practitioner working with Teressa Lower, MD by Mercy Moore, ED Scribe. This patient was seen in room WTR5/WTR5 and the patient's care was started at 12:54 AM.   Chief Complaint  Patient presents with  . Knee Pain      The history is provided by the patient. No language interpreter was used.   HPI Comments: Angel Kemp is a 43 y.o. female who presents to the Emergency Department complaining of left knee pain after running into a radiator at work one week ago. Patient reports being evaluated and the incident and being prescribed ibuprofen. Patient states she has been taking the ibuprofen as directed, icing and elevating the knee. Patient reports the bruising has resolved resolved but she is unable to work if the knee is not bandaged.   Past Medical History  Diagnosis Date  . No pertinent past medical history   . Family history of anesthesia complication     mother  . Cough with hemoptysis 09/21/2011  . Mass in chest 09/21/2011  . Allergy   . Goiter   . Hyperthyroidism   . History of urinary urgency    Past Surgical History  Procedure Laterality Date  . Tubal ligation    . Video bronchoscopy  09/23/2011    Procedure: VIDEO BRONCHOSCOPY WITHOUT FLUORO;  Surgeon: [name], MD;  Location: [facility];  Service: Cardiopulmonary;  Laterality: Bilateral;   Family History  Problem Relation Age of Onset  . Hypertension Mother   . Diabetes type II Mother    History  Substance Use Topics  . Smoking status: Former Smoker    Quit date: 07/07/2011  . Smokeless tobacco: Never Used  . Alcohol Use: No   OB History   Grav Para Term Preterm Abortions TAB SAB Ect Mult Living                 Review of Systems  Musculoskeletal: Positive for arthralgias. Negative for joint swelling.  Skin: Negative for wound.  All other systems reviewed and are  negative.     Allergies  Review of patient's allergies indicates no known allergies.  Home Medications   Prior to Admission medications   Medication Sig Start Date End Date Taking? Authorizing Provider  atenolol (TENORMIN) 25 MG tablet TAKE 1 TABLET EVERY DAY 11/19/12   [name], MD  cefdinir (OMNICEF) 300 MG capsule Take 1 capsule (300 mg total) by mouth 2 (two) times daily. 09/10/12   Billy Fischer, MD  methimazole (TAPAZOLE) 5 MG tablet TAKE 1 TABLET (5 MG TOTAL) BY MOUTH 3 (THREE) TIMES DAILY. 12/28/12   [name], MD  methimazole (TAPAZOLE) 5 MG tablet TAKE 1 TABLET (5 MG TOTAL) BY MOUTH 3 (THREE) TIMES DAILY.    [name], MD   Triage Vitals: BP 156/87  Pulse 90  Temp(Src) 97.8 F (36.6 C) (Oral)  Resp 20  SpO2 100% Physical Exam  Nursing note and vitals reviewed. Constitutional: She is oriented to person, place, and time. She appears well-developed and well-nourished. No distress.  HENT:  Head: Normocephalic and atraumatic.  Eyes: EOM are normal. Pupils are equal, round, and reactive to light.  Neck: Normal range of motion. Neck supple. No tracheal deviation present.  Cardiovascular: Normal rate.   Pulmonary/Chest: Effort normal. No respiratory distress.  Musculoskeletal: She exhibits tenderness. She exhibits no edema.  Neurological: She is alert and oriented to person,  place, and time.  Skin: Skin is warm and dry. No erythema.  Psychiatric: She has a normal mood and affect. Her behavior is normal.    ED Course  Procedures (including critical care time) DIAGNOSTIC STUDIES: Oxygen Saturation is 100% on room air, normal by my interpretation.    COORDINATION OF CARE: 12:00 AM- Will order X-ray of left knee. Discussed treatment plan with patient at bedside and patient agreed to plan.     Labs Review Labs Reviewed - No data to display  Imaging Review Dg Knee Complete 4 Views Left  05/29/2013   CLINICAL DATA:  Injury  EXAM: LEFT KNEE - COMPLETE 4+ VIEW   COMPARISON:  None.  FINDINGS: No acute fracture.  No dislocation.  IMPRESSION: No acute bony pathology.   Electronically Signed   By: Maryclare Bean M.D.   On: 05/29/2013 00:31     EKG Interpretation None      MDM   Final diagnoses:  Contusion, knee        I personally performed the services described in this documentation, which was scribed in my presence. The recorded information has been reviewed and is accurate.   Garald Balding, NP 05/29/13 [phone]

## 2013-05-28 NOTE — ED Notes (Signed)
Pt states she hit her L knee on a radiator at work about a week ago. Pt states her knee will "pop out" at times. Pt arrives with knee sleeve in place to L knee. Pt ambulatory to exam room with steady gait.

## 2013-05-29 ENCOUNTER — Emergency Department (HOSPITAL_COMMUNITY): Payer: BC Managed Care – PPO

## 2013-05-29 NOTE — ED Notes (Signed)
Patient transported to X-ray 

## 2013-05-29 NOTE — Discharge Instructions (Signed)
Contusion A contusion is a deep bruise. Contusions happen when an injury causes bleeding under the skin. Signs of bruising include pain, puffiness (swelling), and discolored skin. The contusion may turn blue, purple, or yellow. HOME CARE   Put ice on the injured area.  Put ice in a plastic bag.  Place a towel between your skin and the bag.  Leave the ice on for 15-20 minutes, 03-04 times a day.  Only take medicine as told by your doctor.  Rest the injured area.  If possible, raise (elevate) the injured area to lessen puffiness. GET HELP RIGHT AWAY IF:   You have more bruising or puffiness.  You have pain that is getting worse.  Your puffiness or pain is not helped by medicine. MAKE SURE YOU:   Understand these instructions.  Will watch your condition.  Will get help right away if you are not doing well or get worse. Document Released: 07/13/2007 Document Revised: 04/18/2011 Document Reviewed: 11/29/2010 [facility] Patient Information 2014 [place], [place]. Your xray is normal

## 2013-05-30 NOTE — ED Provider Notes (Signed)
Medical screening examination/treatment/procedure(s) were performed by non-physician practitioner and as supervising physician I was immediately available for consultation/collaboration.    Teressa Lower, MD 05/30/13 [phone]

## 2013-06-12 ENCOUNTER — Other Ambulatory Visit: Payer: Self-pay | Admitting: Endocrinology

## 2013-07-02 ENCOUNTER — Other Ambulatory Visit: Payer: Self-pay | Admitting: Endocrinology

## 2013-07-08 ENCOUNTER — Other Ambulatory Visit: Payer: Self-pay | Admitting: Endocrinology

## 2013-07-31 ENCOUNTER — Other Ambulatory Visit: Payer: Self-pay | Admitting: Endocrinology

## 2013-08-09 ENCOUNTER — Other Ambulatory Visit: Payer: Self-pay | Admitting: Endocrinology

## 2013-09-15 ENCOUNTER — Other Ambulatory Visit: Payer: Self-pay | Admitting: Endocrinology

## 2013-09-18 ENCOUNTER — Other Ambulatory Visit: Payer: Self-pay | Admitting: Endocrinology

## 2013-10-07 IMAGING — CT CT ANGIO CHEST
2 of 8 series · 17 of 36 positions shown · IV contrast (CONTRAST)
Comparison: Chest radiograph 09/19/2011.

CLINICAL DATA: Hemoptysis.  Sore throat.  Blood change sputum.
Coughing and bronchitis.

CT ANGIOGRAPHY CHEST
TECHNIQUE: Multidetector CT imaging of the chest using the
standard protocol during bolus administration of intravenous
contrast. Multiplanar reconstructed images including MIPs were
obtained and reviewed to evaluate the vascular anatomy.
Contrast: 80mL OMNIPAQUE IOHEXOL 350 MG/ML SOLN

[Series 6: pe thins · axial · 0.68mm/px · z∈[-195,+24]mm · 16 of 249 slices shown]
[im 15/249  lung]
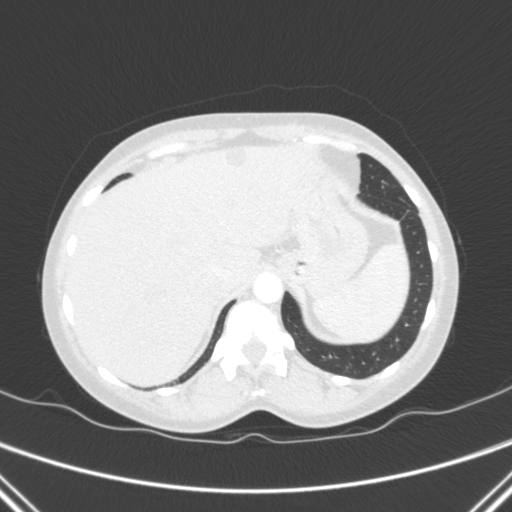
[im 30/249  mediastinal]
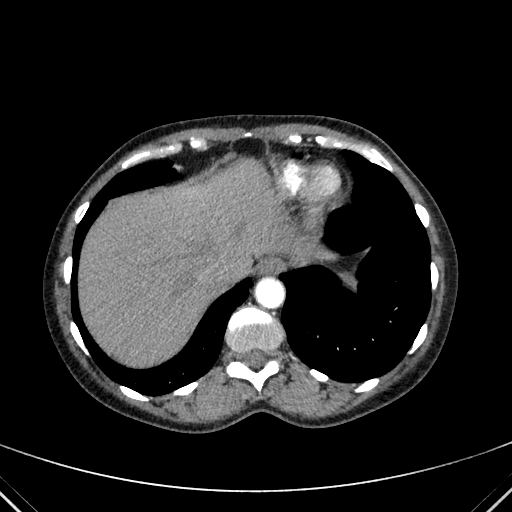
[im 44/249  lung]
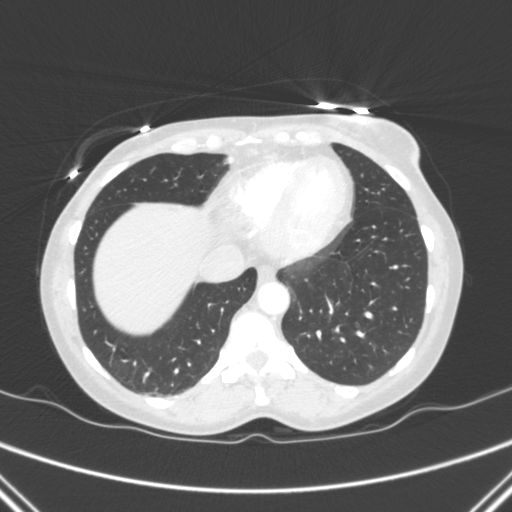
[im 59/249  mediastinal]
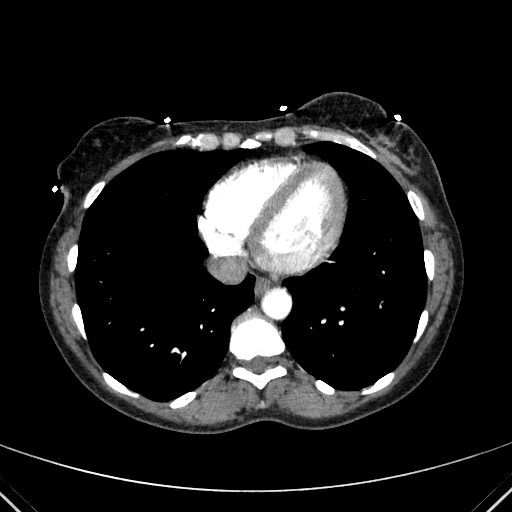
[im 73/249  lung]
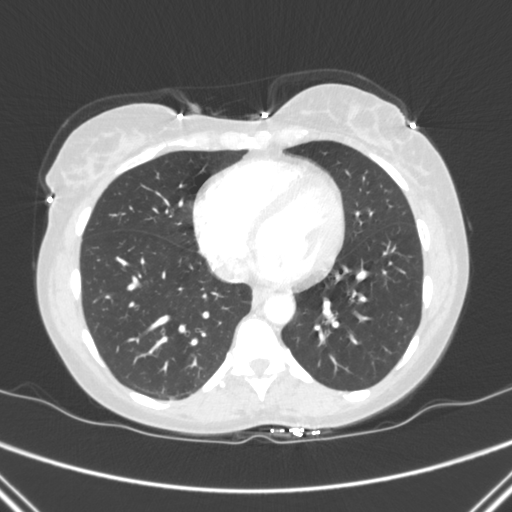
[im 88/249  mediastinal]
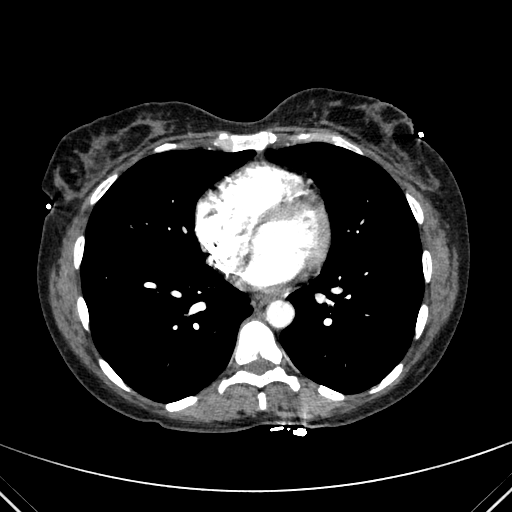
[im 103/249  lung]
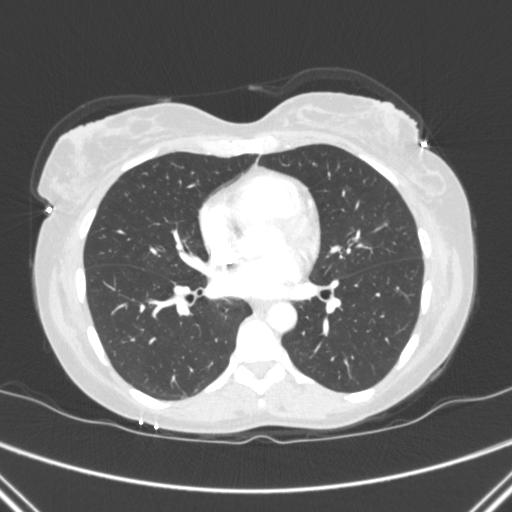
[im 117/249  mediastinal]
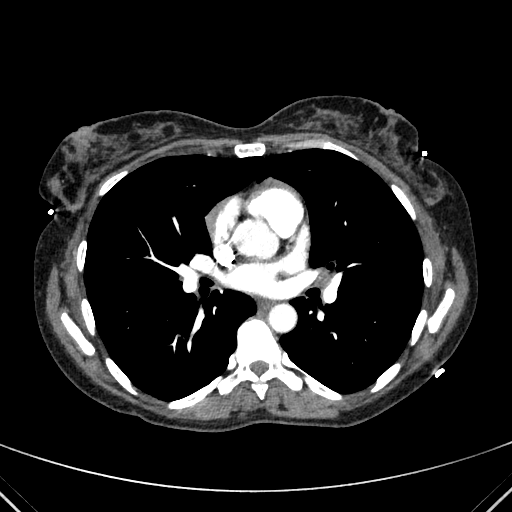
[im 132/249  lung]
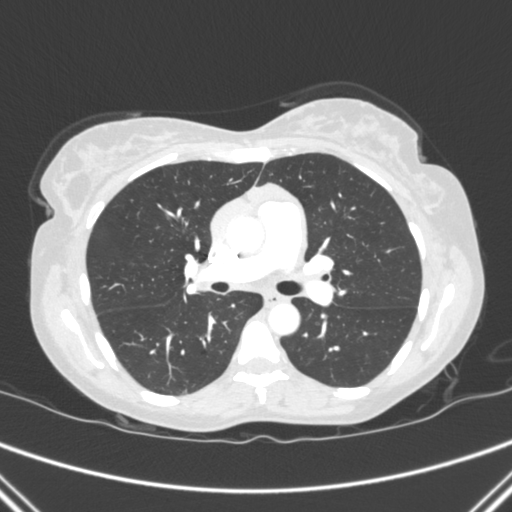
[im 146/249  mediastinal]
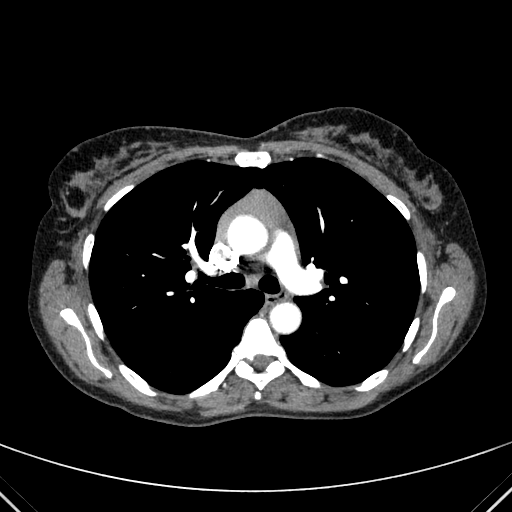
[im 161/249  lung]
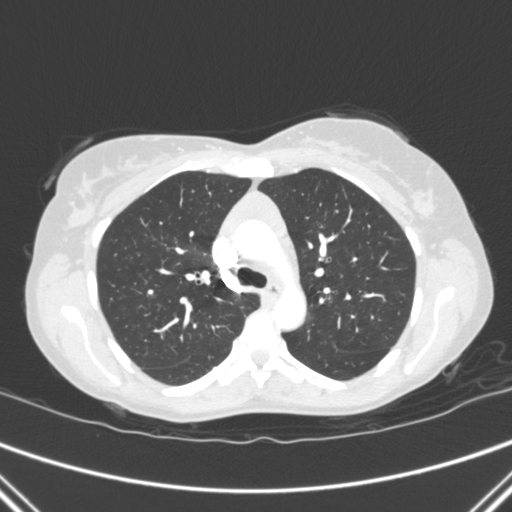
[im 176/249  mediastinal]
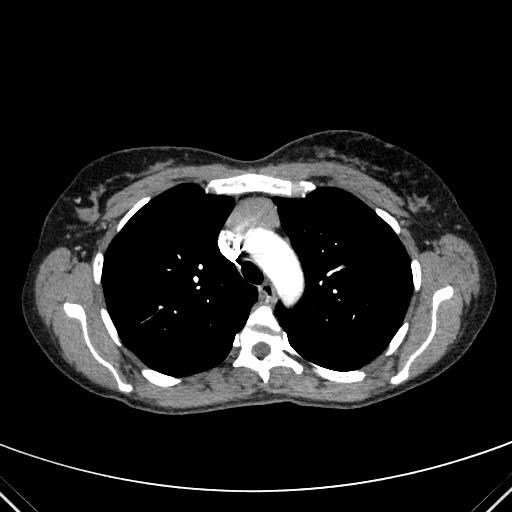
[im 190/249  lung]
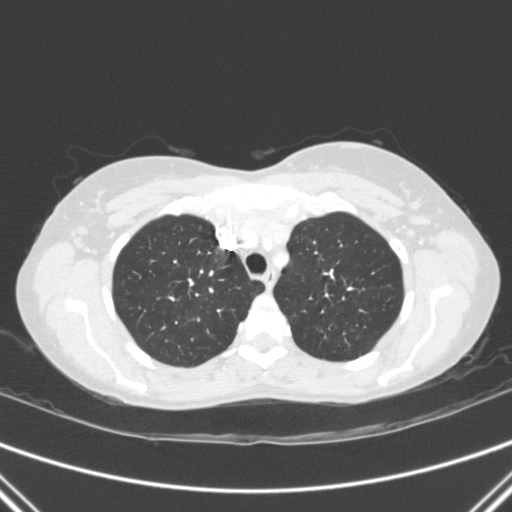
[im 205/249  mediastinal]
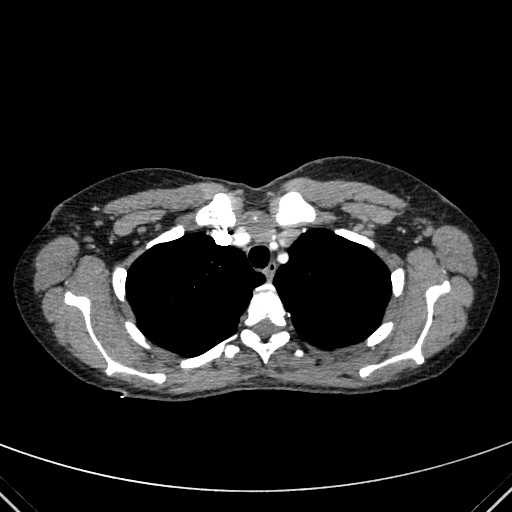
[im 219/249  lung]
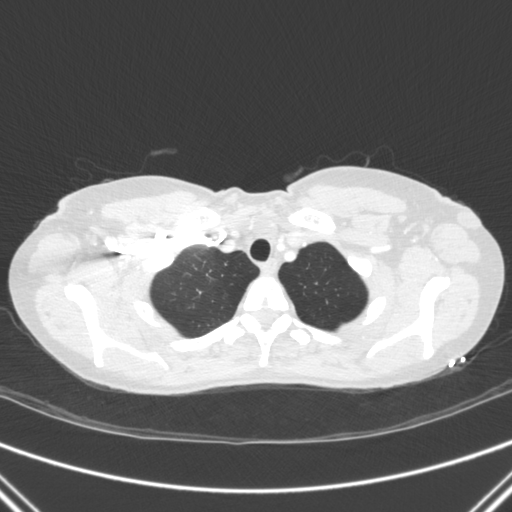
[im 234/249  mediastinal]
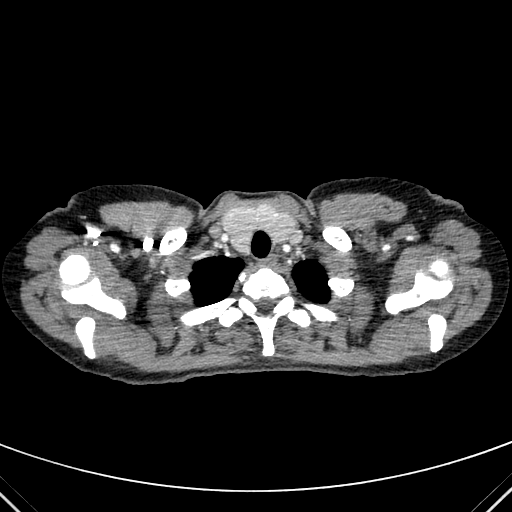

[mpr, coronals, coronal · coronal · 0.68mm/px · 1 of 101 slices shown]
[im 51/101  mediastinal]
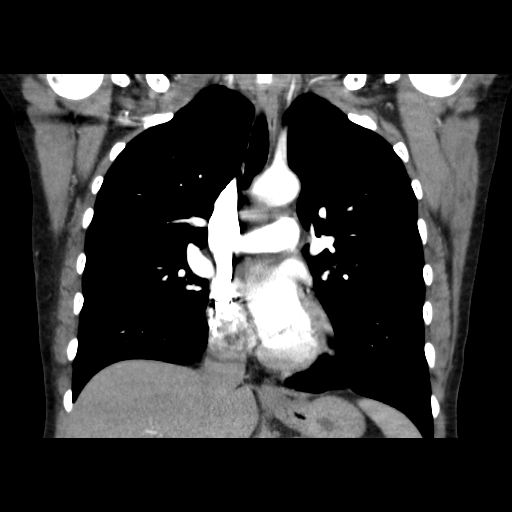

[17 of 36 positions shown; findings below may reference images not displayed]

FINDINGS: Thyroid goiter is present.  13 mm nonspecific low density
thyroid nodule is present in the medial right thyroid lobe.

There is no axillary adenopathy.  There is no hilar or discrete
mediastinal adenopathy.  Anterior mediastinal abnormal soft tissue
is present, which follows a configuration most compatible with
thymic tissue however this should have undergone involution by this
time.

The study is technically adequate for evaluation of pulmonary
embolism.  No pulmonary embolus is present.  Normal three-vessel
aortic arch. No airspace disease.

Incidental imaging the upper abdomen demonstrates a low-density 13
mm left hepatic lobe lesion, statistically likely a cyst.

There is no effusion. Small nonspecific subcutaneous lesion is
present in the lateral aspect of the left axilla measuring 16 mm;
this probably represents a sebaceous cyst.
IMPRESSION: 1.  Anterior mediastinal mass.  Thymic tissue will typically have
undergone involution at this time.  The differential considerations
include thymic hyperplasia, thymoma, or lymphoma.  This does not
appear to be contiguous with the thyroid lobe and demonstrates
lower attenuation than thyroid goiter which is also present.
2.  Thyroid goiter with 13 mm right thyroid lobe nodule. Consider
further evaluation with thyroid ultrasound.  If patient is
clinically hyperthyroid, consider nuclear medicine thyroid uptake
and scan.
3.  Negative for pulmonary embolus.  No acute cardiopulmonary
disease.

## 2013-10-15 ENCOUNTER — Other Ambulatory Visit: Payer: Self-pay | Admitting: Endocrinology

## 2013-10-19 ENCOUNTER — Other Ambulatory Visit: Payer: Self-pay | Admitting: Endocrinology

## 2013-10-30 ENCOUNTER — Other Ambulatory Visit: Payer: Self-pay | Admitting: Endocrinology

## 2013-11-12 ENCOUNTER — Other Ambulatory Visit: Payer: Self-pay | Admitting: Endocrinology

## 2013-11-13 NOTE — Telephone Encounter (Signed)
Please refill x 1 Ov is due  

## 2013-11-13 NOTE — Telephone Encounter (Signed)
Please advise if ok to refill. Pt has not been seen since 07/30/2012.  Thanks!

## 2013-11-18 ENCOUNTER — Other Ambulatory Visit: Payer: Self-pay | Admitting: Endocrinology

## 2013-11-18 NOTE — Telephone Encounter (Signed)
Please refill x 1 Ov is due  

## 2013-11-18 NOTE — Telephone Encounter (Signed)
Please advise if ok to refill. Pt has not been seen since 07/31/2012. Thanks!

## 2013-12-10 ENCOUNTER — Telehealth: Payer: Self-pay

## 2013-12-10 MED ORDER — METHIMAZOLE 5 MG PO TABS: ORAL_TABLET | ORAL | Status: DC

## 2013-12-10 NOTE — Telephone Encounter (Signed)
Please refill x 1 F/u ov is due

## 2013-12-10 NOTE — Telephone Encounter (Signed)
Rx sent to pharmacy   

## 2013-12-10 NOTE — Telephone Encounter (Signed)
Received a refill request for Methimazole. Pt was last seen on 09/10/2012. Please advise if ok to refill. Thanks!

## 2014-01-02 ENCOUNTER — Emergency Department (HOSPITAL_COMMUNITY)
Admission: EM | Admit: 2014-01-02 | Discharge: 2014-01-02 | Disposition: A | Discharge status: Home / Self Care | Payer: Worker's Compensation | Attending: Emergency Medicine | Admitting: Emergency Medicine

## 2014-01-02 ENCOUNTER — Emergency Department (HOSPITAL_COMMUNITY): Payer: Worker's Compensation

## 2014-01-02 ENCOUNTER — Encounter (HOSPITAL_COMMUNITY): Payer: Self-pay | Admitting: *Deleted

## 2014-01-02 DIAGNOSIS — Z8639 Personal history of other endocrine, nutritional and metabolic disease: Secondary | ICD-10-CM | POA: Insufficient documentation

## 2014-01-02 DIAGNOSIS — Y990 Civilian activity done for income or pay: Secondary | ICD-10-CM | POA: Insufficient documentation

## 2014-01-02 DIAGNOSIS — Y9289 Other specified places as the place of occurrence of the external cause: Secondary | ICD-10-CM | POA: Diagnosis not present

## 2014-01-02 DIAGNOSIS — Y9389 Activity, other specified: Secondary | ICD-10-CM | POA: Diagnosis not present

## 2014-01-02 DIAGNOSIS — Z3202 Encounter for pregnancy test, result negative: Secondary | ICD-10-CM | POA: Insufficient documentation

## 2014-01-02 DIAGNOSIS — W208XXA Other cause of strike by thrown, projected or falling object, initial encounter: Secondary | ICD-10-CM | POA: Diagnosis not present

## 2014-01-02 DIAGNOSIS — Z79899 Other long term (current) drug therapy: Secondary | ICD-10-CM | POA: Insufficient documentation

## 2014-01-02 DIAGNOSIS — M79672 Pain in left foot: Secondary | ICD-10-CM

## 2014-01-02 DIAGNOSIS — S99922A Unspecified injury of left foot, initial encounter: Secondary | ICD-10-CM | POA: Diagnosis not present

## 2014-01-02 LAB — POC URINE PREG, ED: Preg Test, Ur: NEGATIVE

## 2014-01-02 MED ORDER — HYDROCODONE-ACETAMINOPHEN 5-325 MG PO TABS: 1.0000 | ORAL_TABLET | Freq: Four times a day (QID) | ORAL | Status: DC | PRN

## 2014-01-02 MED ORDER — ONDANSETRON 8 MG PO TBDP
8.0000 mg | ORAL_TABLET | Freq: Once | ORAL | Status: AC
  Administered 2014-01-02: 8 mg via ORAL
  Filled 2014-01-02: qty 1

## 2014-01-02 MED ORDER — HYDROCODONE-ACETAMINOPHEN 5-325 MG PO TABS
2.0000 | ORAL_TABLET | Freq: Once | ORAL | Status: AC
  Administered 2014-01-02: 2 via ORAL
  Filled 2014-01-02: qty 2

## 2014-01-02 NOTE — ED Notes (Signed)
Pt refused crutches.

## 2014-01-02 NOTE — ED Notes (Signed)
Pt is here with left foot pain after closet door fell at her foot at work. Pt will claim workers comp. Pulses palpable, swelling present to top of left foot.

## 2014-01-02 NOTE — ED Provider Notes (Signed)
CSN: [id number]     Arrival date & time 01/02/14  1700 History   First MD Initiated Contact with Patient 01/02/14 1709     Chief Complaint  Patient presents with  . Foot Pain     (Consider location/radiation/quality/duration/timing/severity/associated sxs/prior Treatment) HPI Comments: Patient is a 43 year old female with history of hypothyroidism, goiter who presents the emergency department today after a wooden closet door fell on her foot. She was at work when this occurred. She has a tingling type pain in the top of her foot. She took 800 mg of ibuprofen at work which did not improve her pain. She has been able to bear weight, but this significantly worsens her pain.   Patient is a 43 y.o. female presenting with lower extremity pain. The history is provided by the patient. No language interpreter was used.  Foot Pain Associated symptoms include arthralgias, joint swelling and myalgias. Pertinent negatives include no abdominal pain, chest pain, chills, fever, nausea or vomiting.    Past Medical History  Diagnosis Date  . No pertinent past medical history   . Family history of anesthesia complication     mother  . Cough with hemoptysis 09/21/2011  . Mass in chest 09/21/2011  . Allergy   . Goiter   . Hyperthyroidism   . History of urinary urgency    Past Surgical History  Procedure Laterality Date  . Tubal ligation    . Video bronchoscopy  09/23/2011    Procedure: VIDEO BRONCHOSCOPY WITHOUT FLUORO;  Surgeon: [name], MD;  Location: [place];  Service: Cardiopulmonary;  Laterality: Bilateral;   Family History  Problem Relation Age of Onset  . Hypertension Mother   . Diabetes type II Mother    History  Substance Use Topics  . Smoking status: Former Smoker    Quit date: 07/07/2011  . Smokeless tobacco: Never Used  . Alcohol Use: No   OB History    No data available     Review of Systems  Constitutional: Negative for fever and chills.  Respiratory:  Negative for shortness of breath.   Cardiovascular: Negative for chest pain.  Gastrointestinal: Negative for nausea, vomiting and abdominal pain.  Musculoskeletal: Positive for myalgias, joint swelling, arthralgias and gait problem.  All other systems reviewed and are negative.     Allergies  Review of patient's allergies indicates no known allergies.  Home Medications   Prior to Admission medications   Medication Sig Start Date End Date Taking? Authorizing Provider  atenolol (TENORMIN) 25 MG tablet TAKE 1 TABLET EVERY DAY 11/19/12   [name], MD  atenolol (TENORMIN) 25 MG tablet TAKE 1 TABLET BY MOUTH EVERY DAY 09/18/13   [name], MD  atenolol (TENORMIN) 25 MG tablet TAKE 1 TABLET BY MOUTH EVERY DAY 11/19/13   [name], MD  cefdinir (OMNICEF) 300 MG capsule Take 1 capsule (300 mg total) by mouth 2 (two) times daily. 09/10/12   Billy Fischer, MD  methimazole (TAPAZOLE) 5 MG tablet TAKE 1 TABLET (5 MG TOTAL) BY MOUTH 3 (THREE) TIMES DAILY. 12/28/12   [name], MD  methimazole (TAPAZOLE) 5 MG tablet Take 1 tablet by mouth 3 times a day. APPOINTMENT NEEDED FOR FURTHER REFILLS. 12/10/13   [name], MD   BP 141/85 mmHg  Pulse 60  Temp(Src) 97.9 F (36.6 C) (Oral)  Resp 18  SpO2 96%  LMP 01/02/2014 Physical Exam  Constitutional: She is oriented to person, place, and time. She appears well-developed and well-nourished. No distress.  HENT:  Head: Normocephalic and atraumatic.  Right Ear: External ear normal.  Left Ear: External ear normal.  Nose: Nose normal.  Mouth/Throat: Oropharynx is clear and moist.  Eyes: Conjunctivae are normal.  Neck: Normal range of motion.  Cardiovascular: Normal rate, regular rhythm, normal heart sounds, intact distal pulses and normal pulses.   Pulses:      Dorsalis pedis pulses are 2+ on the left side.       Posterior tibial pulses are 2+ on the left side.  Capillary refill less than 3 seconds in all toes  Pulmonary/Chest: Effort  normal and breath sounds normal. No stridor. No respiratory distress. She has no wheezes. She has no rales.  Abdominal: Soft. She exhibits no distension.  Musculoskeletal: Normal range of motion.       Feet:  Swelling to left foot. No tenderness to ankle. Neurovascularly intact. Compartments soft.   Neurological: She is alert and oriented to person, place, and time. She has normal strength.  Skin: Skin is warm and dry. She is not diaphoretic. No erythema.  Psychiatric: She has a normal mood and affect. Her behavior is normal.  Nursing note and vitals reviewed.   ED Course  Procedures (including critical care time) Labs Review Labs Reviewed  POC URINE PREG, ED    Imaging Review Dg Foot Complete Left  01/02/2014   CLINICAL DATA:  Pain following cabinet door falling on foot  EXAM: LEFT FOOT - COMPLETE 3+ VIEW  COMPARISON:  None.  FINDINGS: Frontal, oblique, and lateral views were obtained. There is evidence of old trauma involving the distal fifth metatarsal with two small benign exostoses arising from this area. No acute fracture or dislocation. Joint spaces appear intact. No erosive change.  IMPRESSION: Evidence of old trauma involving the distal fifth metatarsal. No acute fracture or dislocation. No appreciable arthropathic change.   Electronically Signed   By: Lowella Grip M.D.   On: 01/02/2014 17:46     EKG Interpretation None      MDM   Final diagnoses:  Foot pain, left    Patient presents emergency department after dropping a weight door on her foot. Tender to palpation to top of the foot. No bruising or ecchymosis. Neurovascularly intact. XR negative for fracture or dislocation. Will give crutches and post op shoe for comfort. Discussed reasons to return to ED immediately. Vital signs stable for discharge. Patient / Family / Caregiver informed of clinical course, understand medical decision-making process, and agree with plan.     Elwyn Lade, PA-C 01/02/14  1805  Ephraim Hamburger, MD 01/02/14 2204

## 2014-01-02 NOTE — ED Notes (Signed)
Pt wanted to have crutches at this time.

## 2014-09-03 ENCOUNTER — Encounter (HOSPITAL_COMMUNITY): Payer: Self-pay

## 2014-09-03 ENCOUNTER — Emergency Department (HOSPITAL_COMMUNITY): Payer: Self-pay

## 2014-09-03 ENCOUNTER — Emergency Department (HOSPITAL_COMMUNITY)
Admission: EM | Admit: 2014-09-03 | Discharge: 2014-09-03 | Disposition: A | Discharge status: Home / Self Care | Payer: Self-pay | Attending: Emergency Medicine | Admitting: Emergency Medicine

## 2014-09-03 DIAGNOSIS — E0590 Thyrotoxicosis, unspecified without thyrotoxic crisis or storm: Secondary | ICD-10-CM | POA: Insufficient documentation

## 2014-09-03 DIAGNOSIS — E049 Nontoxic goiter, unspecified: Secondary | ICD-10-CM | POA: Insufficient documentation

## 2014-09-03 DIAGNOSIS — R0602 Shortness of breath: Secondary | ICD-10-CM | POA: Insufficient documentation

## 2014-09-03 DIAGNOSIS — R0789 Other chest pain: Secondary | ICD-10-CM | POA: Insufficient documentation

## 2014-09-03 DIAGNOSIS — Z792 Long term (current) use of antibiotics: Secondary | ICD-10-CM | POA: Insufficient documentation

## 2014-09-03 DIAGNOSIS — Z87891 Personal history of nicotine dependence: Secondary | ICD-10-CM | POA: Insufficient documentation

## 2014-09-03 LAB — BASIC METABOLIC PANEL
ANION GAP: 8 (ref 5–15)
BUN: 14 mg/dL (ref 6–20)
CHLORIDE: 107 mmol/L (ref 101–111)
CO2: 25 mmol/L (ref 22–32)
Calcium: 9.5 mg/dL (ref 8.9–10.3)
Creatinine, Ser: 0.57 mg/dL (ref 0.44–1.00)
GFR calc Af Amer: >60 mL/min (ref >60)
GFR calc non Af Amer: >60 mL/min (ref >60)
Glucose, Bld: 98 mg/dL (ref 65–99)
POTASSIUM: 3.8 mmol/L (ref 3.5–5.1)
Sodium: 140 mmol/L (ref 135–145)

## 2014-09-03 LAB — I-STAT TROPONIN, ED: TROPONIN I, POC: 0 ng/mL (ref 0.00–0.08)

## 2014-09-03 LAB — CBC
HCT: 37.8 % (ref 36.0–46.0)
Hemoglobin: 12.6 g/dL (ref 12.0–15.0)
MCH: 29 pg (ref 26.0–34.0)
MCHC: 33.3 g/dL (ref 30.0–36.0)
MCV: 87.1 fL (ref 78.0–100.0)
Platelets: 387 10*3/uL (ref 150–400)
RBC: 4.34 MIL/uL (ref 3.87–5.11)
RDW: 13 % (ref 11.5–15.5)
WBC: 8.7 10*3/uL (ref 4.0–10.5)

## 2014-09-03 MED ORDER — NAPROXEN 500 MG PO TABS: 500.0000 mg | ORAL_TABLET | Freq: Two times a day (BID) | ORAL | Status: DC

## 2014-09-03 NOTE — Discharge Instructions (Signed)

## 2014-09-03 NOTE — ED Notes (Signed)
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 Patient states she had chest pain approx 3 years ago and stated she had hypothyroidism/goiter.

## 2014-09-03 NOTE — ED Provider Notes (Signed)
CSN: [id number]     Arrival date & time 09/03/14  1249 History   First MD Initiated Contact with Patient 09/03/14 1311     Chief Complaint  Patient presents with  . Chest Pain  . Shortness of Breath     (Consider location/radiation/quality/duration/timing/severity/associated sxs/prior Treatment) HPI Patient was making a bed at work. She reports that she had been bending over and then it she stood up she got a sudden sharp and tight pain in her right chest. She states that after that occurred she then felt that she was short of breath and sweaty. The pain is easing at this point. She did not have syncopal episode. No recent significant cough or fever. Patient has been well.  Family history is for mother with cardiac disease but living. Past Medical History  Diagnosis Date  . No pertinent past medical history   . Family history of anesthesia complication     mother  . Cough with hemoptysis 09/21/2011  . Mass in chest 09/21/2011  . Allergy   . Goiter   . Hyperthyroidism   . History of urinary urgency    Past Surgical History  Procedure Laterality Date  . Tubal ligation    . Video bronchoscopy  09/23/2011    Procedure: VIDEO BRONCHOSCOPY WITHOUT FLUORO;  Surgeon: [name], MD;  Location: [place];  Service: Cardiopulmonary;  Laterality: Bilateral;   Family History  Problem Relation Age of Onset  . Hypertension Mother   . Diabetes type II Mother    History  Substance Use Topics  . Smoking status: Former Smoker    Quit date: 07/07/2011  . Smokeless tobacco: Never Used  . Alcohol Use: No   OB History    No data available     Review of Systems  10 Systems reviewed and are negative for acute change except as noted in the HPI.   Allergies  Review of patient's allergies indicates no known allergies.  Home Medications   Prior to Admission medications   Medication Sig Start Date End Date Taking? Authorizing Provider  amoxicillin (AMOXIL) 500 MG capsule  Take 500 mg by mouth 4 (four) times daily. 08/06/14  Yes Historical Provider, MD  Hydrocodone-Acetaminophen 5-300 MG TABS Take 1 tablet by mouth every 6 (six) hours as needed (pain).  08/14/14  Yes Historical Provider, MD  ibuprofen (ADVIL,MOTRIN) 800 MG tablet Take 800 mg by mouth daily as needed for mild pain or moderate pain.  08/06/14  Yes Historical Provider, MD  atenolol (TENORMIN) 25 MG tablet TAKE 1 TABLET EVERY DAY Patient not taking: Reported on 09/03/2014 11/19/12   [name], MD  atenolol (TENORMIN) 25 MG tablet TAKE 1 TABLET BY MOUTH EVERY DAY Patient not taking: Reported on 09/03/2014 09/18/13   [name], MD  atenolol (TENORMIN) 25 MG tablet TAKE 1 TABLET BY MOUTH EVERY DAY Patient not taking: Reported on 09/03/2014 11/19/13   [name], MD  methimazole (TAPAZOLE) 5 MG tablet TAKE 1 TABLET (5 MG TOTAL) BY MOUTH 3 (THREE) TIMES DAILY. Patient not taking: Reported on 09/03/2014 12/28/12   [name], MD  methimazole (TAPAZOLE) 5 MG tablet Take 1 tablet by mouth 3 times a day. APPOINTMENT NEEDED FOR FURTHER REFILLS. Patient not taking: Reported on 09/03/2014 12/10/13   [name], MD  naproxen (NAPROSYN) 500 MG tablet Take 1 tablet (500 mg total) by mouth 2 (two) times daily. 09/03/14   [name], MD   BP 111/72 mmHg  Pulse 70  Temp(Src) 98.3 F (36.8 C) (  Oral)  Resp 20  SpO2 100%  LMP 09/02/2014 Physical Exam  Constitutional: She is oriented to person, place, and time. She appears well-developed and well-nourished.  HENT:  Head: Normocephalic and atraumatic.  Eyes: EOM are normal. Pupils are equal, round, and reactive to light.  Neck: Neck supple.  Cardiovascular: Normal rate, regular rhythm, normal heart sounds and intact distal pulses.   Pulmonary/Chest: Effort normal and breath sounds normal. She exhibits tenderness.  Right chest wall tender palpation of the sternal costal margin  Abdominal: Soft. Bowel sounds are normal. She exhibits no distension. There is no  tenderness.  Musculoskeletal: Normal range of motion. She exhibits no edema.  Neurological: She is alert and oriented to person, place, and time. She has normal strength. Coordination normal. GCS eye subscore is 4. GCS verbal subscore is 5. GCS motor subscore is 6.  Skin: Skin is warm, dry and intact.  Psychiatric: She has a normal mood and affect.    ED Course  Procedures (including critical care time) Labs Review Labs Reviewed  BASIC METABOLIC PANEL  CBC  I-STAT [place], ED    Imaging Review Dg Chest 2 View  09/03/2014   CLINICAL DATA:  Right-sided chest pain, shortness of breath, cough  EXAM: CHEST  2 VIEW  COMPARISON:  01/09/2013  FINDINGS: The heart size and mediastinal contours are within normal limits. Both lungs are clear. The visualized skeletal structures are unremarkable.  IMPRESSION: No active cardiopulmonary disease.   Electronically Signed   By: Kathreen Devoid   On: 09/03/2014 13:40     EKG Interpretation   Date/Time:  Wednesday September 03 2014 12:56:43 EDT Ventricular Rate:  101 PR Interval:  128 QRS Duration: 73 QT Interval:  332 QTC Calculation: 430 R Axis:   74 Text Interpretation:  Sinus tachycardia Biatrial enlargement Probable left  ventricular hypertrophy ST elev, probable normal early repol pattern  agree. no change from old Confirmed by Johnney Killian, MD, Jeannie Done [phone]) on  09/03/2014 1:11:54 PM      MDM   Final diagnoses:  Chest wall pain   Patient's chest pain started abruptly with a position change. This was reproducible on her physical examination. She has limited risk factors. The patient's mother has coronary artery disease but is living. She does not have hypertension or diabetes. She is a former smoker. At this time I do feel she is safe for continued outpatient management. Patient is given instructions for signs and symptoms for which to return.    [name], MD 09/03/14 1520

## 2015-01-16 ENCOUNTER — Encounter (HOSPITAL_COMMUNITY): Payer: Self-pay | Admitting: Emergency Medicine

## 2015-01-16 ENCOUNTER — Emergency Department (HOSPITAL_COMMUNITY)
Admission: EM | Admit: 2015-01-16 | Discharge: 2015-01-16 | Disposition: A | Discharge status: Home / Self Care | Payer: BLUE CROSS/BLUE SHIELD | Attending: Emergency Medicine | Admitting: Emergency Medicine

## 2015-01-16 DIAGNOSIS — E0590 Thyrotoxicosis, unspecified without thyrotoxic crisis or storm: Secondary | ICD-10-CM | POA: Diagnosis not present

## 2015-01-16 DIAGNOSIS — Z87891 Personal history of nicotine dependence: Secondary | ICD-10-CM | POA: Diagnosis not present

## 2015-01-16 DIAGNOSIS — M546 Pain in thoracic spine: Secondary | ICD-10-CM | POA: Diagnosis not present

## 2015-01-16 DIAGNOSIS — M549 Dorsalgia, unspecified: Secondary | ICD-10-CM

## 2015-01-16 DIAGNOSIS — Z791 Long term (current) use of non-steroidal anti-inflammatories (NSAID): Secondary | ICD-10-CM | POA: Diagnosis not present

## 2015-01-16 DIAGNOSIS — Z792 Long term (current) use of antibiotics: Secondary | ICD-10-CM | POA: Diagnosis not present

## 2015-01-16 MED ORDER — METHOCARBAMOL 500 MG PO TABS: 500.0000 mg | ORAL_TABLET | Freq: Two times a day (BID) | ORAL | Status: DC

## 2015-01-16 MED ORDER — NAPROXEN 500 MG PO TABS: 500.0000 mg | ORAL_TABLET | Freq: Two times a day (BID) | ORAL | Status: DC

## 2015-01-16 NOTE — ED Notes (Signed)
Pt here for concerns of pain in her right lower and mid back.  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  Pt states she took oxycodone yesterday so she work, and pt states it was effective for a little while.

## 2015-01-16 NOTE — ED Provider Notes (Signed)
CSN: [id number]     Arrival date & time 01/16/15  2052 History  By signing my name below, I, Angel Kemp, attest that this documentation has been prepared under the direction and in the presence of Angel Carnes, PA-C Electronically Signed: Soijett Kemp, ED Scribe. 01/16/2015. 9:31 PM.   Chief Complaint  Patient presents with  . Back Pain      The history is provided by the patient. No language interpreter was used.    HPI Comments: Angel Kemp is a 44 y.o. female who presents to the Emergency Department complaining of constant right lower/mid back pain onset yesterday. 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 She reports that turning to her right side worsens her back pain. She states that she has tried Rx oxycodone with mild relief for her symptoms. Pt denies shoulder pain, gait problem, color change, rash, wound, and any other symptoms.  No numbness, paresthesias or weakness of extremities.  No loss of bowel or bladder control.  VSS.   Past Medical History  Diagnosis Date  . No pertinent past medical history   . Family history of anesthesia complication     mother  . Cough with hemoptysis 09/21/2011  . Mass in chest 09/21/2011  . Allergy   . Goiter   . Hyperthyroidism   . History of urinary urgency    Past Surgical History  Procedure Laterality Date  . Tubal ligation    . Video bronchoscopy  09/23/2011    Procedure: VIDEO BRONCHOSCOPY WITHOUT FLUORO;  Surgeon: [name], MD;  Location: [place];  Service: Cardiopulmonary;  Laterality: Bilateral;  . Wisdom tooth extraction     Family History  Problem Relation Age of Onset  . Hypertension Mother   . Diabetes type II Mother    Social History  Substance Use Topics  . Smoking status: Former Smoker    Quit date: 07/07/2011  . Smokeless tobacco: Never Used  . Alcohol Use:  No   OB History    No data available     Review of Systems  Musculoskeletal: Positive for back pain. Negative for joint swelling, arthralgias and gait problem.  Skin: Negative for color change, rash and wound.  All other systems reviewed and are negative.     Allergies  Review of patient's allergies indicates no known allergies.  Home Medications   Prior to Admission medications   Medication Sig Start Date End Date Taking? Authorizing Provider  amoxicillin (AMOXIL) 500 MG capsule Take 500 mg by mouth 4 (four) times daily. 08/06/14   Historical Provider, MD  atenolol (TENORMIN) 25 MG tablet TAKE 1 TABLET EVERY DAY Patient not taking: Reported on 09/03/2014 11/19/12   [name], MD  atenolol (TENORMIN) 25 MG tablet TAKE 1 TABLET BY MOUTH EVERY DAY Patient not taking: Reported on 09/03/2014 09/18/13   [name], MD  atenolol (TENORMIN) 25 MG tablet TAKE 1 TABLET BY MOUTH EVERY DAY Patient not taking: Reported on 09/03/2014 11/19/13   [name], MD  Hydrocodone-Acetaminophen 5-300 MG TABS Take 1 tablet by mouth every 6 (six) hours as needed (pain).  08/14/14   Historical Provider, MD  ibuprofen (ADVIL,MOTRIN) 800 MG tablet Take 800 mg by mouth daily as needed for mild pain or moderate pain.  08/06/14   Historical Provider, MD  methimazole (TAPAZOLE) 5 MG tablet TAKE  1 TABLET (5 MG TOTAL) BY MOUTH 3 (THREE) TIMES DAILY. Patient not taking: Reported on 09/03/2014 12/28/12   [name], MD  methimazole (TAPAZOLE) 5 MG tablet Take 1 tablet by mouth 3 times a day. APPOINTMENT NEEDED FOR FURTHER REFILLS. Patient not taking: Reported on 09/03/2014 12/10/13   [name], MD  naproxen (NAPROSYN) 500 MG tablet Take 1 tablet (500 mg total) by mouth 2 (two) times daily. 09/03/14   [name], MD   BP 115/73 mmHg  Pulse 68  Temp(Src) 97.3 F (36.3 C) (Oral)  Resp 16  SpO2 99%  LMP 01/06/2015   Physical Exam  Constitutional: She is oriented to person, place, and time. She appears  well-developed and well-nourished. No distress.  HENT:  Head: Normocephalic and atraumatic.  Mouth/Throat: Oropharynx is clear and moist.  Eyes: Conjunctivae and EOM are normal. Pupils are equal, round, and reactive to light.  Neck: Normal range of motion. Neck supple.  Cardiovascular: Normal rate, regular rhythm and normal heart sounds.   Pulmonary/Chest: Effort normal and breath sounds normal. No respiratory distress. She has no wheezes.  Abdominal: Soft. Bowel sounds are normal. There is no tenderness. There is no guarding.  Musculoskeletal: Normal range of motion.       Cervical back: Normal.       Lumbar back: Normal.       Back:  Right thoracic paraspinal tenderness; full ROM maintained, pain when turning right noted; normal strength/sensation of all 4 extremities, normal gait  Neurological: She is alert and oriented to person, place, and time.  Skin: Skin is warm and dry. She is not diaphoretic.  Psychiatric: She has a normal mood and affect.  Nursing note and vitals reviewed.   ED Course  Procedures (including critical care time) DIAGNOSTIC STUDIES: Oxygen Saturation is 99% on RA, nl by my interpretation.    COORDINATION OF CARE: 9:34 PM Discussed treatment plan with pt at bedside and pt agreed to plan.    Labs Review Labs Reviewed - No data to display  Imaging Review No results found. I have personally reviewed and evaluated these images and lab results as part of my medical decision-making.   EKG Interpretation None      MDM   Final diagnoses:  Back pain, unspecified location   44 y.o. 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  Reports pain of right thoracic paraspinal region with TTP noted, no midline deformities.  No neurologic deficits or red flag symptoms on exam.  Do not suspect any acute spinal pathology, most likely muscular strain. Rx robaxin and naprosyn.  Encouraged heat therapy at home as well.  FU with  PCP.  Discussed plan with patient, he/she acknowledged understanding and agreed with plan of care.  Return precautions given for new or worsening symptoms.  I personally performed the services described in this documentation, which was scribed in my presence. The recorded information has been reviewed and is accurate.  Larene Pickett, PA-C 01/16/15 2214  Dorie Rank, MD 01/17/15 [phone]

## 2015-01-16 NOTE — ED Notes (Signed)
Pt able to dress and ambulate independently 

## 2015-01-16 NOTE — Discharge Instructions (Signed)
Take the prescribed medication as directed.  May wish to apply heat to help relax muscles. Follow-up with your primary care physician if continue having issues into next week. Return to the ED for new or worsening symptoms.

## 2015-12-29 ENCOUNTER — Ambulatory Visit
Admission: RE | Admit: 2015-12-29 | Discharge: 2015-12-29 | Disposition: A | Discharge status: Home / Self Care | Payer: Self-pay | Source: Ambulatory Visit | Attending: Family Medicine | Admitting: Family Medicine

## 2015-12-29 ENCOUNTER — Other Ambulatory Visit: Payer: Self-pay | Admitting: Family Medicine

## 2015-12-29 DIAGNOSIS — M545 Low back pain: Secondary | ICD-10-CM

## 2016-05-04 ENCOUNTER — Encounter (HOSPITAL_COMMUNITY): Payer: Self-pay | Admitting: Emergency Medicine

## 2016-05-04 ENCOUNTER — Emergency Department (HOSPITAL_COMMUNITY): Payer: Self-pay

## 2016-05-04 ENCOUNTER — Emergency Department (HOSPITAL_COMMUNITY)
Admission: EM | Admit: 2016-05-04 | Discharge: 2016-05-04 | Disposition: A | Discharge status: Home / Self Care | Payer: Self-pay | Attending: Emergency Medicine | Admitting: Emergency Medicine

## 2016-05-04 DIAGNOSIS — R112 Nausea with vomiting, unspecified: Secondary | ICD-10-CM

## 2016-05-04 DIAGNOSIS — Z87891 Personal history of nicotine dependence: Secondary | ICD-10-CM | POA: Insufficient documentation

## 2016-05-04 DIAGNOSIS — Z91040 Latex allergy status: Secondary | ICD-10-CM | POA: Insufficient documentation

## 2016-05-04 DIAGNOSIS — J069 Acute upper respiratory infection, unspecified: Secondary | ICD-10-CM

## 2016-05-04 DIAGNOSIS — R197 Diarrhea, unspecified: Secondary | ICD-10-CM | POA: Insufficient documentation

## 2016-05-04 LAB — CBC
HCT: 37.2 % (ref 36.0–46.0)
Hemoglobin: 12.2 g/dL (ref 12.0–15.0)
MCH: 27.7 pg (ref 26.0–34.0)
MCHC: 32.8 g/dL (ref 30.0–36.0)
MCV: 84.4 fL (ref 78.0–100.0)
PLATELETS: 286 10*3/uL (ref 150–400)
RBC: 4.41 MIL/uL (ref 3.87–5.11)
RDW: 13.1 % (ref 11.5–15.5)
WBC: 6.3 10*3/uL (ref 4.0–10.5)

## 2016-05-04 LAB — URINALYSIS, ROUTINE W REFLEX MICROSCOPIC
BILIRUBIN URINE: NEGATIVE
Bacteria, UA: NONE SEEN
Glucose, UA: NEGATIVE mg/dL
Ketones, ur: NEGATIVE mg/dL
Leukocytes, UA: NEGATIVE
Nitrite: NEGATIVE
Protein, ur: NEGATIVE mg/dL
SPECIFIC GRAVITY, URINE: 1.024 (ref 1.005–1.030)
pH: 5 (ref 5.0–8.0)

## 2016-05-04 LAB — COMPREHENSIVE METABOLIC PANEL
ALK PHOS: 71 U/L (ref 38–126)
ALT: 24 U/L (ref 14–54)
AST: 20 U/L (ref 15–41)
Albumin: 3.5 g/dL (ref 3.5–5.0)
Anion gap: 12 (ref 5–15)
BUN: 13 mg/dL (ref 6–20)
CALCIUM: 10.2 mg/dL (ref 8.9–10.3)
CHLORIDE: 105 mmol/L (ref 101–111)
CO2: 23 mmol/L (ref 22–32)
CREATININE: 0.42 mg/dL — AB (ref 0.44–1.00)
GFR calc Af Amer: >60 mL/min (ref >60)
GFR calc non Af Amer: >60 mL/min (ref >60)
Glucose, Bld: 104 mg/dL — ABNORMAL HIGH (ref 65–99)
Potassium: 3.7 mmol/L (ref 3.5–5.1)
Sodium: 140 mmol/L (ref 135–145)
Total Bilirubin: 0.5 mg/dL (ref 0.3–1.2)
Total Protein: 7.2 g/dL (ref 6.5–8.1)

## 2016-05-04 LAB — I-STAT CG4 LACTIC ACID, ED: Lactic Acid, Venous: 1.45 mmol/L (ref 0.5–1.9)

## 2016-05-04 LAB — TSH: TSH: <0.01 u[IU]/mL — ABNORMAL LOW (ref 0.350–4.500)

## 2016-05-04 LAB — I-STAT BETA HCG BLOOD, ED (MC, WL, AP ONLY): I-stat hCG, quantitative: <5 m[IU]/mL (ref <5)

## 2016-05-04 LAB — LIPASE, BLOOD: LIPASE: 17 U/L (ref 11–51)

## 2016-05-04 MED ORDER — FLUTICASONE PROPIONATE 50 MCG/ACT NA SUSP: 2.0000 | Freq: Every day | NASAL | 0 refills | Status: DC

## 2016-05-04 MED ORDER — DM-GUAIFENESIN ER 30-600 MG PO TB12: 1.0000 | ORAL_TABLET | Freq: Two times a day (BID) | ORAL | 0 refills | Status: DC | PRN

## 2016-05-04 MED ORDER — SODIUM CHLORIDE 0.9 % IV BOLUS (SEPSIS)
1000.0000 mL | Freq: Once | INTRAVENOUS | Status: AC
  Administered 2016-05-04: 1000 mL via INTRAVENOUS

## 2016-05-04 MED ORDER — BENZONATATE 100 MG PO CAPS: 100.0000 mg | ORAL_CAPSULE | Freq: Three times a day (TID) | ORAL | 0 refills | Status: DC | PRN

## 2016-05-04 MED ORDER — ATENOLOL 25 MG PO TABS: 25.0000 mg | ORAL_TABLET | Freq: Every day | ORAL | 0 refills | Status: DC

## 2016-05-04 NOTE — ED Triage Notes (Signed)
Pt here for N/V/D, body aches and chills with congestion x 1 week

## 2016-05-04 NOTE — ED Provider Notes (Signed)
[facility] DEPT Provider Note   CSN: [id number] Arrival date & time: 05/04/16  1510     History   Chief Complaint Chief Complaint  Patient presents with  . Emesis    HPI Angel Kemp is a 46 y.o. female with PMHx of hyperthyroidism presents to the ED for complaints of nausea, vomiting, diarrhea for 4 days. She reports associated body aches and chills with nasal congestion, nonproductive cough, CP secondary to cough, SOB secondary to cough. She has tried dayquil, nyquil, peptobismol, halls with no relief. Nothing makes her symptoms worse/better. She reports fever of Tmax 101 at home on Thursday of last week, none since. She denies Hx of asthma, COPD. She denies abdominal pain. She denies any blood in stool. She reports hx of smoking and quit in 2012. Hx of recent travel. She states she works at [employer] and might have had sick contact through work. She denies taking any medication for 3 years. She denies any PCP.   The history is provided by the patient. No language interpreter was used.    Past Medical History:  Diagnosis Date  . Allergy   . Cough with hemoptysis 09/21/2011  . Family history of anesthesia complication    mother  . Goiter   . History of urinary urgency   . Hyperthyroidism   . Mass in chest 09/21/2011  . No pertinent past medical history     Patient Active Problem List   Diagnosis Date Noted  . Chest pain 07/23/2012  . Thyroid nodule 09/22/2011  . Hyperthyroidism 09/22/2011  . Hemoptysis 09/21/2011  . 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 TUBAL LIGATION    . VIDEO BRONCHOSCOPY  09/23/2011   Procedure: VIDEO BRONCHOSCOPY WITHOUT FLUORO;  Surgeon: [name], MD;  Location: [facility];  Service: Cardiopulmonary;  Laterality: Bilateral;  . WISDOM TOOTH EXTRACTION      OB History    No data available       Home Medications    Prior to Admission medications   Medication  Sig Start Date End Date Taking? Authorizing Provider  bismuth subsalicylate (PEPTO BISMOL) 262 MG/15ML suspension Take 30 mLs by mouth every 6 (six) hours as needed.   Yes Historical Provider, MD  Pseudoeph-Doxylamine-DM-APAP (DAYQUIL/NYQUIL COLD/FLU RELIEF PO) Take 30 mLs by mouth 2 (two) times daily as needed (COLD).   Yes Historical Provider, MD  atenolol (TENORMIN) 25 MG tablet Take 1 tablet (25 mg total) by mouth daily. 05/04/16   Francisco Manuel [place], [place]  benzonatate (TESSALON) 100 MG capsule Take 1 capsule (100 mg total) by mouth 3 (three) times daily as needed for cough. 05/04/16   Francisco Manuel [place], [place]  dextromethorphan-guaiFENesin (MUCINEX DM) 30-600 MG 12hr tablet Take 1 tablet by mouth 2 (two) times daily as needed for cough. 05/04/16   Francisco Manuel [place], [place]  fluticasone (FLONASE) 50 MCG/ACT nasal spray Place 2 sprays into both nostrils daily. 05/04/16   Francisco Manuel [place], [place]  methimazole (TAPAZOLE) 5 MG tablet TAKE 1 TABLET (5 MG TOTAL) BY MOUTH 3 (THREE) TIMES DAILY. Patient not taking: Reported on 09/03/2014 12/28/12   [name], MD    Family History Family History  Problem Relation Age of Onset  . Hypertension Mother   . Diabetes type II Mother     Social History Social History  Substance Use Topics  . Smoking status: Former Smoker    Quit date: 07/07/2011  . Smokeless tobacco: Never Used  .  Alcohol use No     Allergies   Latex   Review of Systems Review of Systems  Constitutional: Positive for appetite change, chills and fever.  HENT: Positive for congestion.   Respiratory: Positive for cough and shortness of breath.   Cardiovascular: Positive for chest pain.  Gastrointestinal: Positive for diarrhea, nausea and vomiting. Negative for abdominal pain.  Genitourinary: Negative for difficulty urinating and dysuria.  Musculoskeletal: Positive for arthralgias and myalgias.  Skin: Negative for rash and wound.  Neurological: Positive for headaches.  Negative for numbness.  All other systems reviewed and are negative.    Physical Exam Updated Vital Signs BP 140/79   Pulse (!) 134   Temp 98.6 F (37 C) (Oral)   Resp (!) 27   LMP 03/19/2016   SpO2 100%   Physical Exam  Constitutional: She is oriented to person, place, and time. She appears well-developed and well-nourished.  Well appearing  HENT:  Head: Normocephalic and atraumatic.  Right Ear: External ear normal.  Left Ear: External ear normal.  Nose: Nose normal.  Mouth/Throat: Oropharynx is clear and moist. No oropharyngeal exudate.  Oropharynx without evidence of redness or exudates. Tonsils without evidence of redness, swelling, or exudates. TM's appear normal with no evidence of bulging. EAC appear non erythematous and not swollen  Eyes: EOM are normal. Pupils are equal, round, and reactive to light.  Neck: Normal range of motion.  Normal ROM. No nuchal rigidity.   Cardiovascular: Normal rate, normal heart sounds and intact distal pulses.   Pulmonary/Chest: Effort normal and breath sounds normal. No respiratory distress. She has no wheezes. She has no rales.  Lungs CTA. No wheezing. No rales. No stridor. Normal work of breathing  Abdominal: Soft. Bowel sounds are normal. There is no tenderness. There is no rebound and no guarding.  Soft and mildly tender to upper abdomen. Otherwise soft and nontender. No rebound tenderness. No guarding. Negative murphy's sign. No focal tenderness at McBurney's point. No CVA tenderness. No evidence of hernia.  Neurological: She is alert and oriented to person, place, and time.  Skin: Skin is warm. Capillary refill takes less than 2 seconds.  Psychiatric: She has a normal mood and affect. Her behavior is normal.  Nursing note and vitals reviewed.    ED Treatments / Results  Labs (all labs ordered are listed, but only abnormal results are displayed) Labs Reviewed  COMPREHENSIVE METABOLIC PANEL - Abnormal; Notable for the following:         Result Value   Glucose, Bld 104 (*)    Creatinine, Ser 0.42 (*)    All other components within normal limits  URINALYSIS, ROUTINE W REFLEX MICROSCOPIC - Abnormal; Notable for the following:    APPearance HAZY (*)    Hgb urine dipstick SMALL (*)    Squamous Epithelial / LPF 0-5 (*)    All other components within normal limits  LIPASE, BLOOD  CBC  TSH  I-STAT BETA HCG BLOOD, ED ([facility], [facility], [facility] ONLY)  I-STAT CG4 LACTIC ACID, ED    EKG  EKG Interpretation None       Radiology Dg Chest 2 View  Result Date: 05/04/2016 CLINICAL DATA:  Initial evaluation for productive cough, worsened over past week with fever and congestion. EXAM: CHEST  2 VIEW COMPARISON:  Prior radiograph from 09/03/2014. FINDINGS: The cardiac and mediastinal silhouettes are stable in size and contour, and remain within normal limits. The lungs are normally inflated. No airspace consolidation, pleural effusion, or pulmonary edema is identified.  There is no pneumothorax. No acute osseous abnormality identified. IMPRESSION: No active cardiopulmonary disease. Electronically Signed   By: Jeannine Boga M.D.   On: 05/04/2016 21:30    Procedures Procedures (including critical care time)  Medications Ordered in ED Medications  sodium chloride 0.9 % bolus 1,000 mL (0 mLs Intravenous Stopped 05/04/16 2238)     Initial Impression / Assessment and Plan / ED Course  I have reviewed the triage vital signs and the nursing notes.  Pertinent labs & imaging results that were available during my care of the patient were reviewed by me and considered in my medical decision making (see chart for details).    Patients symptoms are consistent with URI, likely viral etiology. On exam, pt in NAD. Hemodynamically stable. No hypoxia. Afebrile here. Lungs clear, Heart sounds clear. Normal work of breathing. TMs clear. Throat benign. Abdomen mildly tender to upper abdomen, soft. No rebound tenderness or guarding. Likely secondary  to cough. Pt CXR negative for acute infiltrate.  Discussed that antibiotics are not indicated for viral infections. Patient does have history of hyperparathyroidism and is untreated for the last 3 years. This most likely explains her elevated heart rate here in ED. Lab work is reassuring.  Patient will be given atenolol which she was on previously instructed and instructions to follow up with a primary care provider using the financial resource guide attached. I stressed the importance and counseled the patient on treating her hyperthyroidism with a PCP. Pt will be discharged with symptomatic treatment.  Verbalizes understanding and is agreeable with plan. Pt is hemodynamically stable & in NAD prior to dc. Pt case discussed with Dr. Rogene Houston who agreed with assessment and plan.    Final Clinical Impressions(s) / ED Diagnoses   Final diagnoses:  Upper respiratory tract infection, unspecified type  Nausea vomiting and diarrhea    New Prescriptions Discharge Medication List as of 05/04/2016 10:26 PM    START taking these medications   Details  benzonatate (TESSALON) 100 MG capsule Take 1 capsule (100 mg total) by mouth 3 (three) times daily as needed for cough., Starting Wed 05/04/2016, Print    dextromethorphan-guaiFENesin (MUCINEX DM) 30-600 MG 12hr tablet Take 1 tablet by mouth 2 (two) times daily as needed for cough., Starting Wed 05/04/2016, Print    fluticasone (FLONASE) 50 MCG/ACT nasal spray Place 2 sprays into both nostrils daily., Starting Wed 05/04/2016, [place], [place] 05/04/16 2349    Fredia Sorrow, MD 05/06/16 [phone]

## 2016-05-04 NOTE — Discharge Instructions (Signed)
Please see attached financial resource guide. Please schedule an appointment tomorrow regarding today's visit. Please take atenolol once a day. Please continue to drink at least a glass of water throughout the day with Pedialyte.  1. Medications: flonase, mucinex, tessalon, usual home medications 2. Treatment: rest, drink plenty of fluids, take tylenol or ibuprofen for fever control 3. Follow Up: Please followup with your primary doctor in 3 days for discussion of your diagnoses and further evaluation after today's visit; if you do not have a primary care doctor use the resource guide provided to find one; Return to the ER for high fevers, difficulty breathing or other concerning symptoms   Get help right away if: You have severe or persistent: Headache. Ear pain. Sinus pain. Chest pain. You have chronic lung disease and any of the following: Wheezing. Prolonged cough. Coughing up blood. A change in your usual mucus. You have a stiff neck. You have changes in your: Vision. Hearing. Thinking. Mood.

## 2016-05-05 ENCOUNTER — Telehealth: Payer: Self-pay | Admitting: Endocrinology

## 2016-05-05 NOTE — Telephone Encounter (Signed)
please call patient: Ov next available

## 2016-05-05 NOTE — Telephone Encounter (Signed)
Patient scheduled for 05/19/2016 at 215 pm.

## 2016-05-11 ENCOUNTER — Emergency Department (HOSPITAL_COMMUNITY): Payer: Managed Care, Other (non HMO)

## 2016-05-11 ENCOUNTER — Encounter (HOSPITAL_COMMUNITY): Payer: Self-pay

## 2016-05-11 ENCOUNTER — Emergency Department (HOSPITAL_COMMUNITY)
Admission: EM | Admit: 2016-05-11 | Discharge: 2016-05-11 | Disposition: A | Discharge status: Home / Self Care | Payer: Managed Care, Other (non HMO) | Attending: Emergency Medicine | Admitting: Emergency Medicine

## 2016-05-11 DIAGNOSIS — K922 Gastrointestinal hemorrhage, unspecified: Secondary | ICD-10-CM

## 2016-05-11 DIAGNOSIS — K920 Hematemesis: Secondary | ICD-10-CM | POA: Diagnosis present

## 2016-05-11 DIAGNOSIS — Z91040 Latex allergy status: Secondary | ICD-10-CM | POA: Insufficient documentation

## 2016-05-11 DIAGNOSIS — Z79899 Other long term (current) drug therapy: Secondary | ICD-10-CM | POA: Diagnosis not present

## 2016-05-11 DIAGNOSIS — Z87891 Personal history of nicotine dependence: Secondary | ICD-10-CM | POA: Insufficient documentation

## 2016-05-11 LAB — CBC
HCT: 36.1 % (ref 36.0–46.0)
Hemoglobin: 12.3 g/dL (ref 12.0–15.0)
MCH: 28.4 pg (ref 26.0–34.0)
MCHC: 34.1 g/dL (ref 30.0–36.0)
MCV: 83.4 fL (ref 78.0–100.0)
PLATELETS: 267 10*3/uL (ref 150–400)
RBC: 4.33 MIL/uL (ref 3.87–5.11)
RDW: 13.2 % (ref 11.5–15.5)
WBC: 9.5 10*3/uL (ref 4.0–10.5)

## 2016-05-11 LAB — URINALYSIS, ROUTINE W REFLEX MICROSCOPIC
BILIRUBIN URINE: NEGATIVE
Bacteria, UA: NONE SEEN
Glucose, UA: NEGATIVE mg/dL
Ketones, ur: NEGATIVE mg/dL
Leukocytes, UA: NEGATIVE
NITRITE: NEGATIVE
Protein, ur: NEGATIVE mg/dL
SPECIFIC GRAVITY, URINE: 1.02 (ref 1.005–1.030)
pH: 5 (ref 5.0–8.0)

## 2016-05-11 LAB — LIPASE, BLOOD: Lipase: 23 U/L (ref 11–51)

## 2016-05-11 LAB — POC OCCULT BLOOD, ED: FECAL OCCULT BLD: POSITIVE — AB

## 2016-05-11 LAB — COMPREHENSIVE METABOLIC PANEL
ALK PHOS: 64 U/L (ref 38–126)
ALT: 32 U/L (ref 14–54)
AST: 25 U/L (ref 15–41)
Albumin: 3.9 g/dL (ref 3.5–5.0)
Anion gap: 10 (ref 5–15)
BILIRUBIN TOTAL: 0.7 mg/dL (ref 0.3–1.2)
BUN: 12 mg/dL (ref 6–20)
CALCIUM: 10 mg/dL (ref 8.9–10.3)
CO2: 23 mmol/L (ref 22–32)
CREATININE: 0.42 mg/dL — AB (ref 0.44–1.00)
Chloride: 104 mmol/L (ref 101–111)
GFR calc Af Amer: >60 mL/min (ref >60)
Glucose, Bld: 106 mg/dL — ABNORMAL HIGH (ref 65–99)
POTASSIUM: 3.5 mmol/L (ref 3.5–5.1)
Sodium: 137 mmol/L (ref 135–145)
TOTAL PROTEIN: 7.7 g/dL (ref 6.5–8.1)

## 2016-05-11 LAB — PREGNANCY, URINE: PREG TEST UR: NEGATIVE

## 2016-05-11 MED ORDER — ONDANSETRON HCL 4 MG PO TABS
4.0000 mg | ORAL_TABLET | Freq: Four times a day (QID) | ORAL | 0 refills | Status: DC
Start: 2016-05-11 — End: 2020-06-24

## 2016-05-11 MED ORDER — SODIUM CHLORIDE 0.9 % IV BOLUS (SEPSIS)
1000.0000 mL | Freq: Once | INTRAVENOUS | Status: AC
  Administered 2016-05-11: 1000 mL via INTRAVENOUS

## 2016-05-11 MED ORDER — PANTOPRAZOLE SODIUM 20 MG PO TBEC: 20.0000 mg | DELAYED_RELEASE_TABLET | Freq: Two times a day (BID) | ORAL | 0 refills | Status: DC

## 2016-05-11 MED ORDER — ONDANSETRON HCL 4 MG/2ML IJ SOLN
4.0000 mg | Freq: Once | INTRAMUSCULAR | Status: AC
  Administered 2016-05-11: 4 mg via INTRAVENOUS
  Filled 2016-05-11: qty 2

## 2016-05-11 NOTE — ED Triage Notes (Signed)
Pt was seen last week and was treated for a URI, her sx aren't any better and now tonight while at work was vomiting bright red blood

## 2016-05-11 NOTE — ED Provider Notes (Signed)
[facility] DEPT Provider Note   CSN: [id number] Arrival date & time: 05/11/16  0125     History   Chief Complaint Chief Complaint  Patient presents with  . Hematemesis    HPI Angel Kemp is a 46 y.o. female.  Patient presents to the emergency department primarily for evaluation of hematemesis. Patient reports that she was at work when she started feeling poorly. She began to have diarrhea and then became nauseated. She had an episode of vomiting and noticed bright red blood in the vomit. She has vomited several times since with blood. She has not had any coffee-ground emesis. No melanotic stools.  Also reports that she continues to have upper respiratory infection symptoms. She was seen a week ago with cough and chest congestion. She has not had any improvement with her cough.      Past Medical History:  Diagnosis Date  . Allergy   . Cough with hemoptysis 09/21/2011  . Family history of anesthesia complication    mother  . Goiter   . History of urinary urgency   . Hyperthyroidism   . Mass in chest 09/21/2011  . No pertinent past medical history     Patient Active Problem List   Diagnosis Date Noted  . Chest pain 07/23/2012  . Thyroid nodule 09/22/2011  . Hyperthyroidism 09/22/2011  . Hemoptysis 09/21/2011  . 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 TUBAL LIGATION    . VIDEO BRONCHOSCOPY  09/23/2011   Procedure: VIDEO BRONCHOSCOPY WITHOUT FLUORO;  Surgeon: [name], MD;  Location: [facility];  Service: Cardiopulmonary;  Laterality: Bilateral;  . WISDOM TOOTH EXTRACTION      OB History    No data available       Home Medications    Prior to Admission medications   Medication Sig Start Date End Date Taking? Authorizing Provider  atenolol (TENORMIN) 25 MG tablet Take 1 tablet (25 mg total) by mouth daily. 05/04/16  Yes Francisco [address] [place], [place]  benzonatate (TESSALON) 100 MG capsule Take  1 capsule (100 mg total) by mouth 3 (three) times daily as needed for cough. 05/04/16  Yes Francisco [address] [place], [place]  dextromethorphan-guaiFENesin (MUCINEX DM) 30-600 MG 12hr tablet Take 1 tablet by mouth 2 (two) times daily as needed for cough. 05/04/16  Yes Francisco Manuel [place], [place]  fluticasone (FLONASE) 50 MCG/ACT nasal spray Place 2 sprays into both nostrils daily. 05/04/16  Yes Francisco [address] [place], [place]  methimazole (TAPAZOLE) 10 MG tablet Take 30 mg by mouth daily.   Yes Historical Provider, MD  methimazole (TAPAZOLE) 5 MG tablet TAKE 1 TABLET (5 MG TOTAL) BY MOUTH 3 (THREE) TIMES DAILY. Patient not taking: Reported on 05/11/2016 12/28/12   [name], MD  ondansetron (ZOFRAN) 4 MG tablet Take 1 tablet (4 mg total) by mouth every 6 (six) hours. 05/11/16   Orpah Greek, MD  pantoprazole (PROTONIX) 20 MG tablet Take 1 tablet (20 mg total) by mouth 2 (two) times daily. 05/11/16   Orpah Greek, MD    Family History Family History  Problem Relation Age of Onset  . Hypertension Mother   . Diabetes type II Mother     Social History Social History  Substance Use Topics  . Smoking status: Former Smoker    Quit date: 07/07/2011  . Smokeless tobacco: Never Used  . Alcohol use No     Allergies   Latex   Review of Systems Review of Systems  Respiratory: Positive for cough.   Gastrointestinal: Positive for abdominal pain, diarrhea, nausea and vomiting.  All other systems reviewed and are negative.    Physical Exam Updated Vital Signs BP (!) 118/55 (BP Location: Right Arm)   Pulse (!) 103   Temp 98.2 F (36.8 C) (Oral)   Resp 16   Ht 5\' 4"  (1.626 m)   Wt 165 lb (74.8 kg)   LMP 03/19/2016   SpO2 97%   BMI 28.32 kg/m   Physical Exam  Constitutional: She is oriented to person, place, and time. She appears well-developed and well-nourished. No distress.  HENT:  Head: Normocephalic and atraumatic.  Right Ear: Hearing normal.  Left Ear: Hearing normal.    Nose: Nose normal.  Mouth/Throat: Oropharynx is clear and moist and mucous membranes are normal.  Eyes: Conjunctivae and EOM are normal. Pupils are equal, round, and reactive to light.  Neck: Normal range of motion. Neck supple.  Cardiovascular: Regular rhythm, S1 normal and S2 normal.  Exam reveals no gallop and no friction rub.   No murmur heard. Pulmonary/Chest: Effort normal and breath sounds normal. No respiratory distress. She exhibits no tenderness.  Abdominal: Soft. Normal appearance and bowel sounds are normal. There is no hepatosplenomegaly. There is tenderness in the epigastric area. There is no rebound, no guarding, no tenderness at McBurney's point and negative Murphy's sign. No hernia.  Musculoskeletal: Normal range of motion.  Neurological: She is alert and oriented to person, place, and time. She has normal strength. No cranial nerve deficit or sensory deficit. Coordination normal. GCS eye subscore is 4. GCS verbal subscore is 5. GCS motor subscore is 6.  Skin: Skin is warm, dry and intact. No rash noted. No cyanosis.  Psychiatric: She has a normal mood and affect. Her speech is normal and behavior is normal. Thought content normal.  Nursing note and vitals reviewed.    ED Treatments / Results  Labs (all labs ordered are listed, but only abnormal results are displayed) Labs Reviewed  COMPREHENSIVE METABOLIC PANEL - Abnormal; Notable for the following:       Result Value   Glucose, Bld 106 (*)    Creatinine, Ser 0.42 (*)    All other components within normal limits  URINALYSIS, ROUTINE W REFLEX MICROSCOPIC - Abnormal; Notable for the following:    Hgb urine dipstick SMALL (*)    Squamous Epithelial / LPF 0-5 (*)    All other components within normal limits  POC OCCULT BLOOD, ED - Abnormal; Notable for the following:    Fecal Occult Bld POSITIVE (*)    All other components within normal limits  LIPASE, BLOOD  CBC  PREGNANCY, URINE    EKG  EKG  Interpretation None       Radiology Dg Abd Acute W/chest  Result Date: 05/11/2016 CLINICAL DATA:  Cough congestion and dyspnea. Mid abdominal pain for 2 or 3 weeks. EXAM: DG ABDOMEN ACUTE W/ 1V CHEST COMPARISON:  05/04/2016 FINDINGS: Normal abdominal gas pattern. No biliary or urinary calculi. The upright view of the chest demonstrates mild chronic appearing interstitial coarsening. No confluent consolidation or large effusion. Normal heart size. IMPRESSION: Negative abdominal radiographs.  No acute cardiopulmonary disease. Electronically Signed   By: Andreas Newport M.D.   On: 05/11/2016 06:23    Procedures Procedures (including critical care time)  Medications Ordered in ED Medications  ondansetron (ZOFRAN) injection 4 mg (4 mg Intravenous Given 05/11/16 0523)  sodium chloride 0.9 % bolus 1,000 mL (0 mLs Intravenous Stopped 05/11/16  0630)     Initial Impression / Assessment and Plan / ED Course  I have reviewed the triage vital signs and the nursing notes.  Pertinent labs & imaging results that were available during my care of the patient were reviewed by me and considered in my medical decision making (see chart for details).     Patient presents to the ER for evaluation of hematemesis. Patient became acutely ill at work, had diarrhea followed by nausea and vomiting. She reports 2 or 3 episodes of vomiting that had bright red blood. She has not had any further vomiting. There has not been any coffee-ground emesis and she has not had melanotic stools. Vital signs are normal. No hypotension. Normal hemoglobin, no anemia. Abdominal exam benign. Suspected there was some element of Mallory-Weiss tear, however, will need follow-up. She does not require hospitalization at this time. Stool was brown but it was heme positive, cannot rule out more significant upper GI bleed. Patient therefore given precautions to return to be any coffee-ground emesis, weakness, syncope. Otherwise follow-up with  gastroenterology for further evaluation of upper GI bleeding.  Final Clinical Impressions(s) / ED Diagnoses   Final diagnoses:  Upper GI bleed    New Prescriptions New Prescriptions   ONDANSETRON (ZOFRAN) 4 MG TABLET    Take 1 tablet (4 mg total) by mouth every 6 (six) hours.   PANTOPRAZOLE (PROTONIX) 20 MG TABLET    Take 1 tablet (20 mg total) by mouth 2 (two) times daily.     Orpah Greek, MD 05/11/16 [phone]

## 2016-05-11 NOTE — Discharge Instructions (Signed)
Return to the ER immediately if you have worsening vomiting of blood, vomiting brown coffee-ground like vomiting, black stools or weakness, dizziness, passing out.

## 2016-05-19 ENCOUNTER — Ambulatory Visit: Payer: Self-pay | Admitting: Endocrinology

## 2016-06-02 DIAGNOSIS — N3941 Urge incontinence: Secondary | ICD-10-CM | POA: Insufficient documentation

## 2016-10-27 DIAGNOSIS — E042 Nontoxic multinodular goiter: Secondary | ICD-10-CM | POA: Insufficient documentation

## 2016-10-27 DIAGNOSIS — E0500 Thyrotoxicosis with diffuse goiter without thyrotoxic crisis or storm: Secondary | ICD-10-CM | POA: Insufficient documentation

## 2017-04-12 DIAGNOSIS — M545 Low back pain, unspecified: Secondary | ICD-10-CM | POA: Insufficient documentation

## 2017-04-12 DIAGNOSIS — G8929 Other chronic pain: Secondary | ICD-10-CM | POA: Insufficient documentation

## 2017-12-27 ENCOUNTER — Encounter (HOSPITAL_COMMUNITY): Payer: Self-pay

## 2017-12-27 ENCOUNTER — Emergency Department (HOSPITAL_COMMUNITY)
Admission: EM | Admit: 2017-12-27 | Discharge: 2017-12-27 | Disposition: A | Discharge status: Home / Self Care | Payer: Managed Care, Other (non HMO) | Attending: Emergency Medicine | Admitting: Emergency Medicine

## 2017-12-27 DIAGNOSIS — L0211 Cutaneous abscess of neck: Secondary | ICD-10-CM | POA: Insufficient documentation

## 2017-12-27 DIAGNOSIS — Z87891 Personal history of nicotine dependence: Secondary | ICD-10-CM | POA: Insufficient documentation

## 2017-12-27 DIAGNOSIS — Z79899 Other long term (current) drug therapy: Secondary | ICD-10-CM | POA: Insufficient documentation

## 2017-12-27 DIAGNOSIS — L0291 Cutaneous abscess, unspecified: Secondary | ICD-10-CM

## 2017-12-27 MED ORDER — LIDOCAINE HCL (PF) 1 % IJ SOLN
5.0000 mL | Freq: Once | INTRAMUSCULAR | Status: AC
  Administered 2017-12-27: 5 mL
  Filled 2017-12-27: qty 30

## 2017-12-27 MED ORDER — SULFAMETHOXAZOLE-TRIMETHOPRIM 800-160 MG PO TABS: 1.0000 | ORAL_TABLET | Freq: Two times a day (BID) | ORAL | 0 refills | Status: AC

## 2017-12-27 MED ORDER — TETANUS-DIPHTH-ACELL PERTUSSIS 5-2.5-18.5 LF-MCG/0.5 IM SUSP
0.5000 mL | Freq: Once | INTRAMUSCULAR | Status: AC
  Administered 2017-12-27: 0.5 mL via INTRAMUSCULAR
  Filled 2017-12-27: qty 0.5

## 2017-12-27 NOTE — ED Provider Notes (Signed)
[facility] DEPT Provider Note   CSN: [id number] Arrival date & time: 12/27/17  1614     History   Chief Complaint Chief Complaint  Patient presents with  . Abscess    HPI Angel Kemp is a 47 y.o. female with no significant past medical history who presents for evaluation of abscess.  Patient states she has had an abscess located to her left neck x1 week.  Patient states she has had an abscess to this area previously approximately 3 years ago.  Patient states this area has been growing in size.  States this area is painful.  She rates her pain a 5/10.  Pain does not radiate.  Not taking anything for her pain.  Denies fever, chills, nausea, vomiting, decreased range of motion in her neck, neck stiffness.  Denies aggravating or alleviating factors.  Patient is not diabetic or immunocompromise.  History obtained from patient.  No interpreter was used.  HPI  Past Medical History:  Diagnosis Date  . Allergy   . Cough with hemoptysis 09/21/2011  . Family history of anesthesia complication    mother  . Goiter   . History of urinary urgency   . Hyperthyroidism   . Mass in chest 09/21/2011  . No pertinent past medical history     Patient Active Problem List   Diagnosis Date Noted  . Chest pain 07/23/2012  . Thyroid nodule 09/22/2011  . Hyperthyroidism 09/22/2011  . Hemoptysis 09/21/2011  . 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 TUBAL LIGATION    . VIDEO BRONCHOSCOPY  09/23/2011   Procedure: VIDEO BRONCHOSCOPY WITHOUT FLUORO;  Surgeon: [name], MD;  Location: [facility];  Service: Cardiopulmonary;  Laterality: Bilateral;  . WISDOM TOOTH EXTRACTION       OB History   None      Home Medications    Prior to Admission medications   Medication Sig Start Date End Date Taking? Authorizing Provider  atenolol (TENORMIN) 25 MG tablet Take 1 tablet (25 mg total) by mouth daily.  05/04/16   Bettey Costa, PA  benzonatate (TESSALON) 100 MG capsule Take 1 capsule (100 mg total) by mouth 3 (three) times daily as needed for cough. 05/04/16   Bettey Costa, PA  dextromethorphan-guaiFENesin (MUCINEX DM) 30-600 MG 12hr tablet Take 1 tablet by mouth 2 (two) times daily as needed for cough. 05/04/16   Espina, Kemper Durie, PA  fluticasone (FLONASE) 50 MCG/ACT nasal spray Place 2 sprays into both nostrils daily. 05/04/16   Bettey Costa, PA  methimazole (TAPAZOLE) 10 MG tablet Take 30 mg by mouth daily.    [provider]  methimazole (TAPAZOLE) 5 MG tablet TAKE 1 TABLET (5 MG TOTAL) BY MOUTH 3 (THREE) TIMES DAILY. Patient not taking: Reported on 05/11/2016 12/28/12   [name], MD  ondansetron (ZOFRAN) 4 MG tablet Take 1 tablet (4 mg total) by mouth every 6 (six) hours. 05/11/16   Orpah Greek, MD  pantoprazole (PROTONIX) 20 MG tablet Take 1 tablet (20 mg total) by mouth 2 (two) times daily. 05/11/16   Orpah Greek, MD  sulfamethoxazole-trimethoprim (BACTRIM DS,SEPTRA DS) 800-160 MG tablet Take 1 tablet by mouth 2 (two) times daily for 7 days. 12/27/17 01/03/18  Amarilys Lyles A, PA-C    Family History Family History  Problem Relation Age of Onset  . Hypertension Mother   . Diabetes type II Mother     Social History Social  History   Tobacco Use  . Smoking status: Former Smoker    Last attempt to quit: 07/07/2011    Years since quitting: 6.4  . Smokeless tobacco: Never Used  Substance Use Topics  . Alcohol use: No  . Drug use: No     Allergies   Latex   Review of Systems Review of Systems  Constitutional: Negative.   HENT: Negative.   Respiratory: Negative.   Cardiovascular: Negative.   Gastrointestinal: Negative.   Genitourinary: Negative.   Musculoskeletal: Negative.   Skin:       Left lateral neck abscess.  Neurological: Negative.   All other systems reviewed and are  negative.    Physical Exam Updated Vital Signs BP (!) 151/87   Pulse 75   Temp 98.5 F (36.9 C) (Oral)   Resp 16   Ht 5\' 3"  (1.6 m)   Wt 85.5 kg   LMP 12/26/2017   SpO2 95%   BMI 33.38 kg/m   Physical Exam  Constitutional: She appears well-developed and well-nourished. No distress.  HENT:  Head: Atraumatic.  Mouth/Throat: Oropharynx is clear and moist.  Eyes: Pupils are equal, round, and reactive to light.  Neck: Normal range of motion, full passive range of motion without pain and phonation normal. Neck supple. No neck rigidity. No edema, no erythema and normal range of motion present.  Cardiovascular: Normal rate, regular rhythm, normal heart sounds and intact distal pulses. Exam reveals no gallop and no friction rub.  No murmur heard. Pulmonary/Chest: Breath sounds normal. No stridor. No respiratory distress. She has no wheezes. She has no rales. She exhibits no tenderness.  Abdominal: Soft. Bowel sounds are normal. She exhibits no distension and no mass. There is no tenderness. There is no rebound and no guarding.  Musculoskeletal: Normal range of motion.  Neurological: She is alert.  Skin: Skin is warm and dry. She is not diaphoretic.  2 cm rounded fluctuant nodule to left lateral neck.  Area with crusting from previous discharge.  Area is not pulsatile.  There is no area of surrounding induration or erythema.  No warmth.  Psychiatric: She has a normal mood and affect.  Nursing note and vitals reviewed.    ED Treatments / Results  Labs (all labs ordered are listed, but only abnormal results are displayed) Labs Reviewed - No data to display  EKG None  Radiology No results found.  Procedures .Marland KitchenIncision and Drainage Date/Time: 12/27/2017 7:25 PM Performed by: Nettie Elm, PA-C Authorized by: Nettie Elm, PA-C   Consent:    Consent obtained:  Verbal   Consent given by:  Patient   Risks discussed:  Bleeding, incomplete drainage, pain, infection  and damage to other organs   Alternatives discussed:  No treatment, delayed treatment, alternative treatment, observation and referral Location:    Type:  Cyst   Size:  2cm rounded   Location:  Neck   Neck location:  L anterior Pre-procedure details:    Skin preparation:  Betadine Anesthesia (see MAR for exact dosages):    Anesthesia method:  Local infiltration   Local anesthetic:  Lidocaine 1% w/o epi Procedure type:    Complexity:  Simple Procedure details:    Needle aspiration: no     Incision types:  Stab incision   Incision depth:  Subcutaneous   Scalpel blade:  11   Wound management:  Irrigated with saline   Drainage:  Purulent   Drainage amount:  Moderate   Packing materials:  1/4 in gauze  Post-procedure details:    Patient tolerance of procedure:  Tolerated well, no immediate complications   (including critical care time)  Medications Ordered in ED Medications  lidocaine (PF) (XYLOCAINE) 1 % injection 5 mL (5 mLs Infiltration Given 12/27/17 1846)  Tdap (BOOSTRIX) injection 0.5 mL (0.5 mLs Intramuscular Given 12/27/17 1845)     Initial Impression / Assessment and Plan / ED Course  I have reviewed the triage vital signs and the nursing notes.  Pertinent labs & imaging results that were available during my care of the patient were reviewed by me and considered in my medical decision making (see chart for details).  47 year old female who appears otherwise well presents for evaluation of left neck abscess.  Approximately 2 similar rounded fluctuant nodule to the left lateral neck.  Area with crusting from previous area of discharge.  Area not pulsatile.  No surrounding induration or erythema or warmth.  No evidence of cellulitis. Neck with full range of motion without stiffness.  Denies fever, chills, nausea, vomiting, or immunocompromise state.  Will trial I&D and reevaluate.  See procedure note for drainage.  Exam consistent with possible abscess versus infected cyst.   Discussed with patient packing will need to be removed and be reevaluated in 2 days.  Will DC home on antibiotics and have patient follow-up with ENT.  Encourage warm soaks and flushing.   Discussed strict return precautions.  She is hemodynamically stable and appropriate for DC home at this time.  Low suspicion for emergent pathology.  Patient voiced understanding of return precautions and is agreeable for follow-up.    Final Clinical Impressions(s) / ED Diagnoses   Final diagnoses:  Abscess    ED Discharge Orders         Ordered    sulfamethoxazole-trimethoprim (BACTRIM DS,SEPTRA DS) 800-160 MG tablet  2 times daily     12/27/17 1924           Cartel Mauss A, PA-C 12/27/17 Deeann Saint, MD 12/27/17 2325

## 2017-12-27 NOTE — Discharge Instructions (Addendum)
Were evaluated today for abscess in your neck.  This is consistent with a possible infected cyst.  Prescribing antibiotic.  We have placed packing.  This will need to be removed in 2 days.  Please follow-up with ear nose and throat for reevaluation.  Return to the ED for new or worsening symptoms.

## 2017-12-27 NOTE — ED Triage Notes (Signed)
Patient c/o 2 abscesses on left side of neck that has been coming and going for 2 months.   7/10 throbbing, itching, burning pain. A/Ox4.

## 2018-05-21 IMAGING — CR DG CHEST 2V
2 series · 2 of 2 positions shown · non-contrast
Comparison: Prior radiograph from 09/03/2014.

CLINICAL DATA: Initial evaluation for productive cough, worsened
over past week with fever and congestion.

EXAM:
CHEST  2 VIEW

[chest pa]
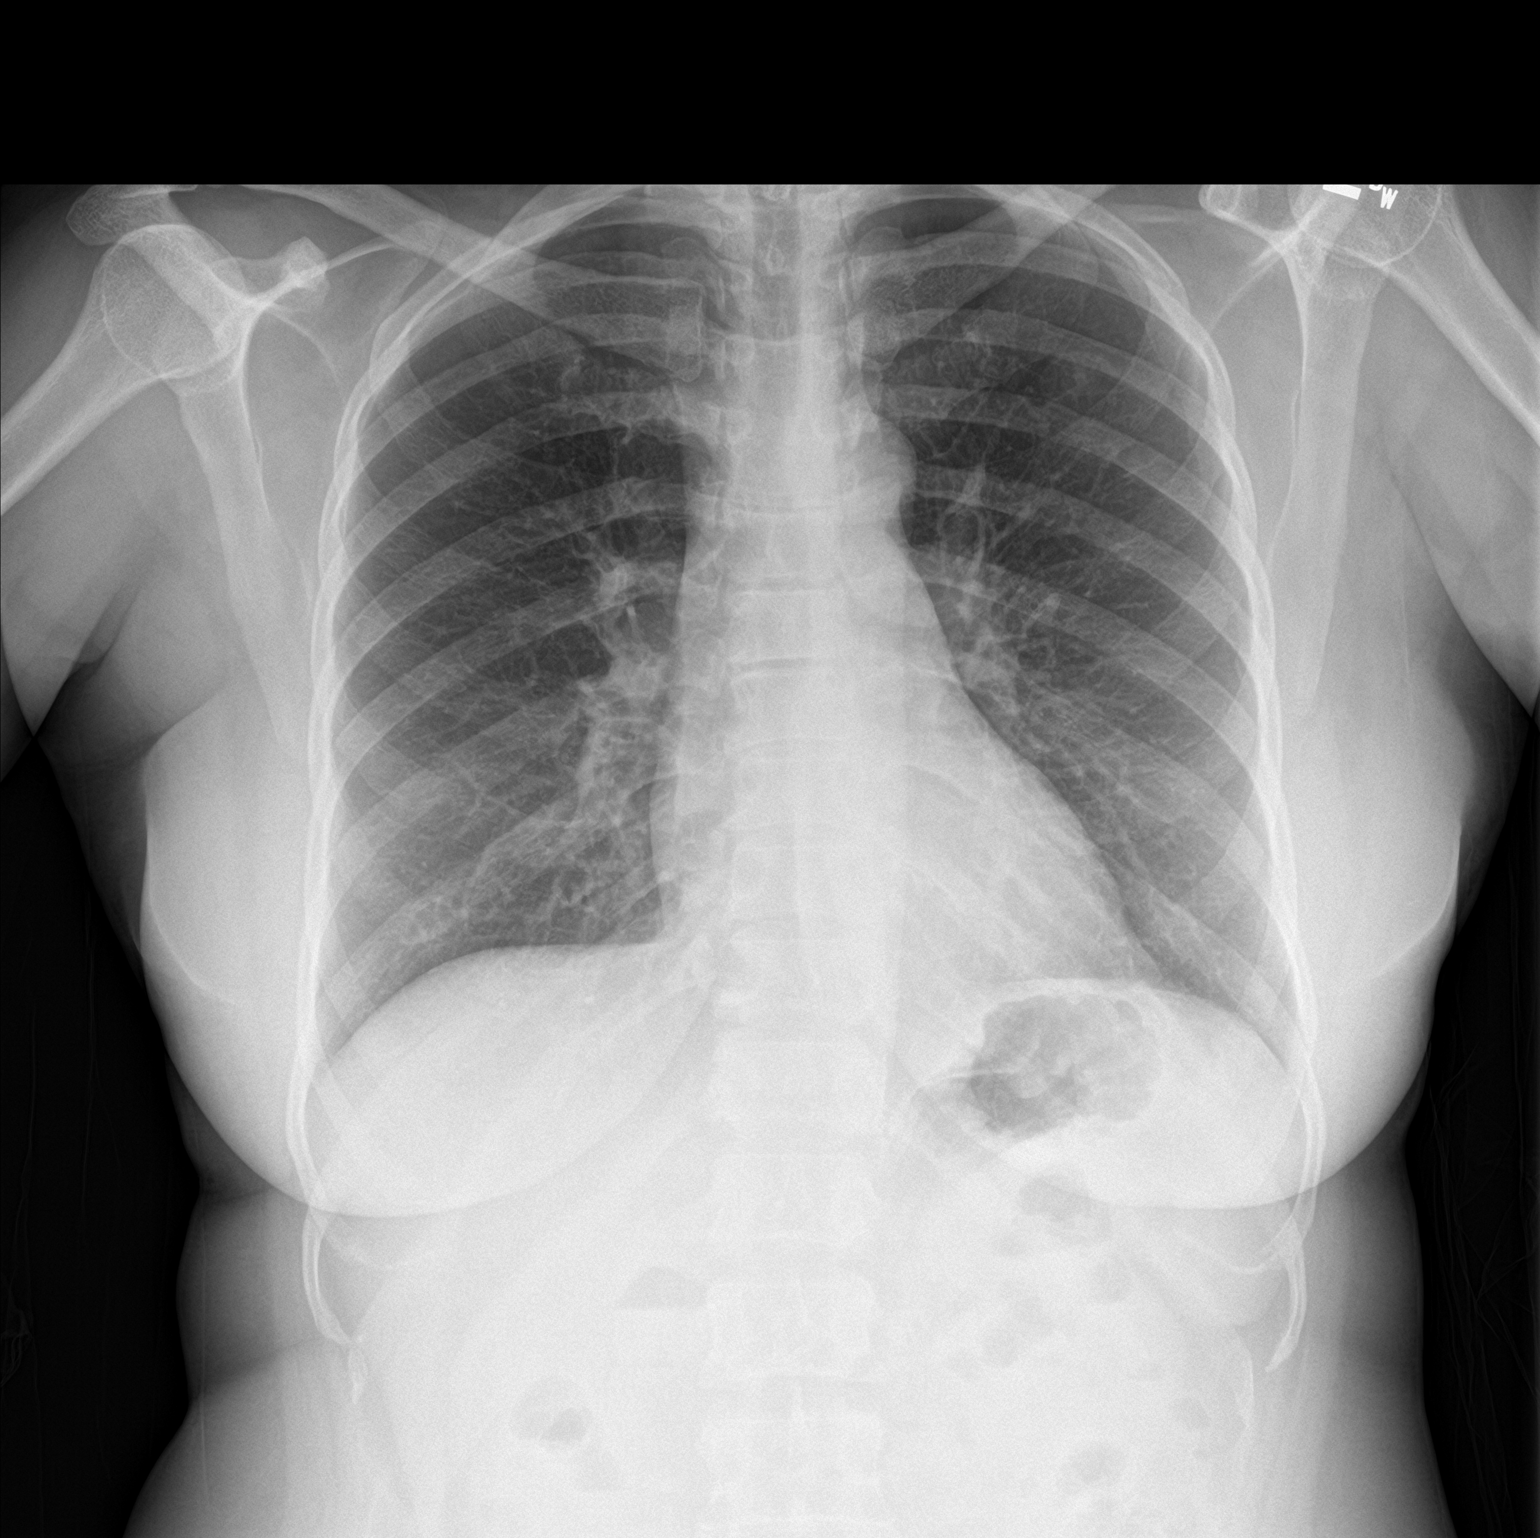

[chest lat]
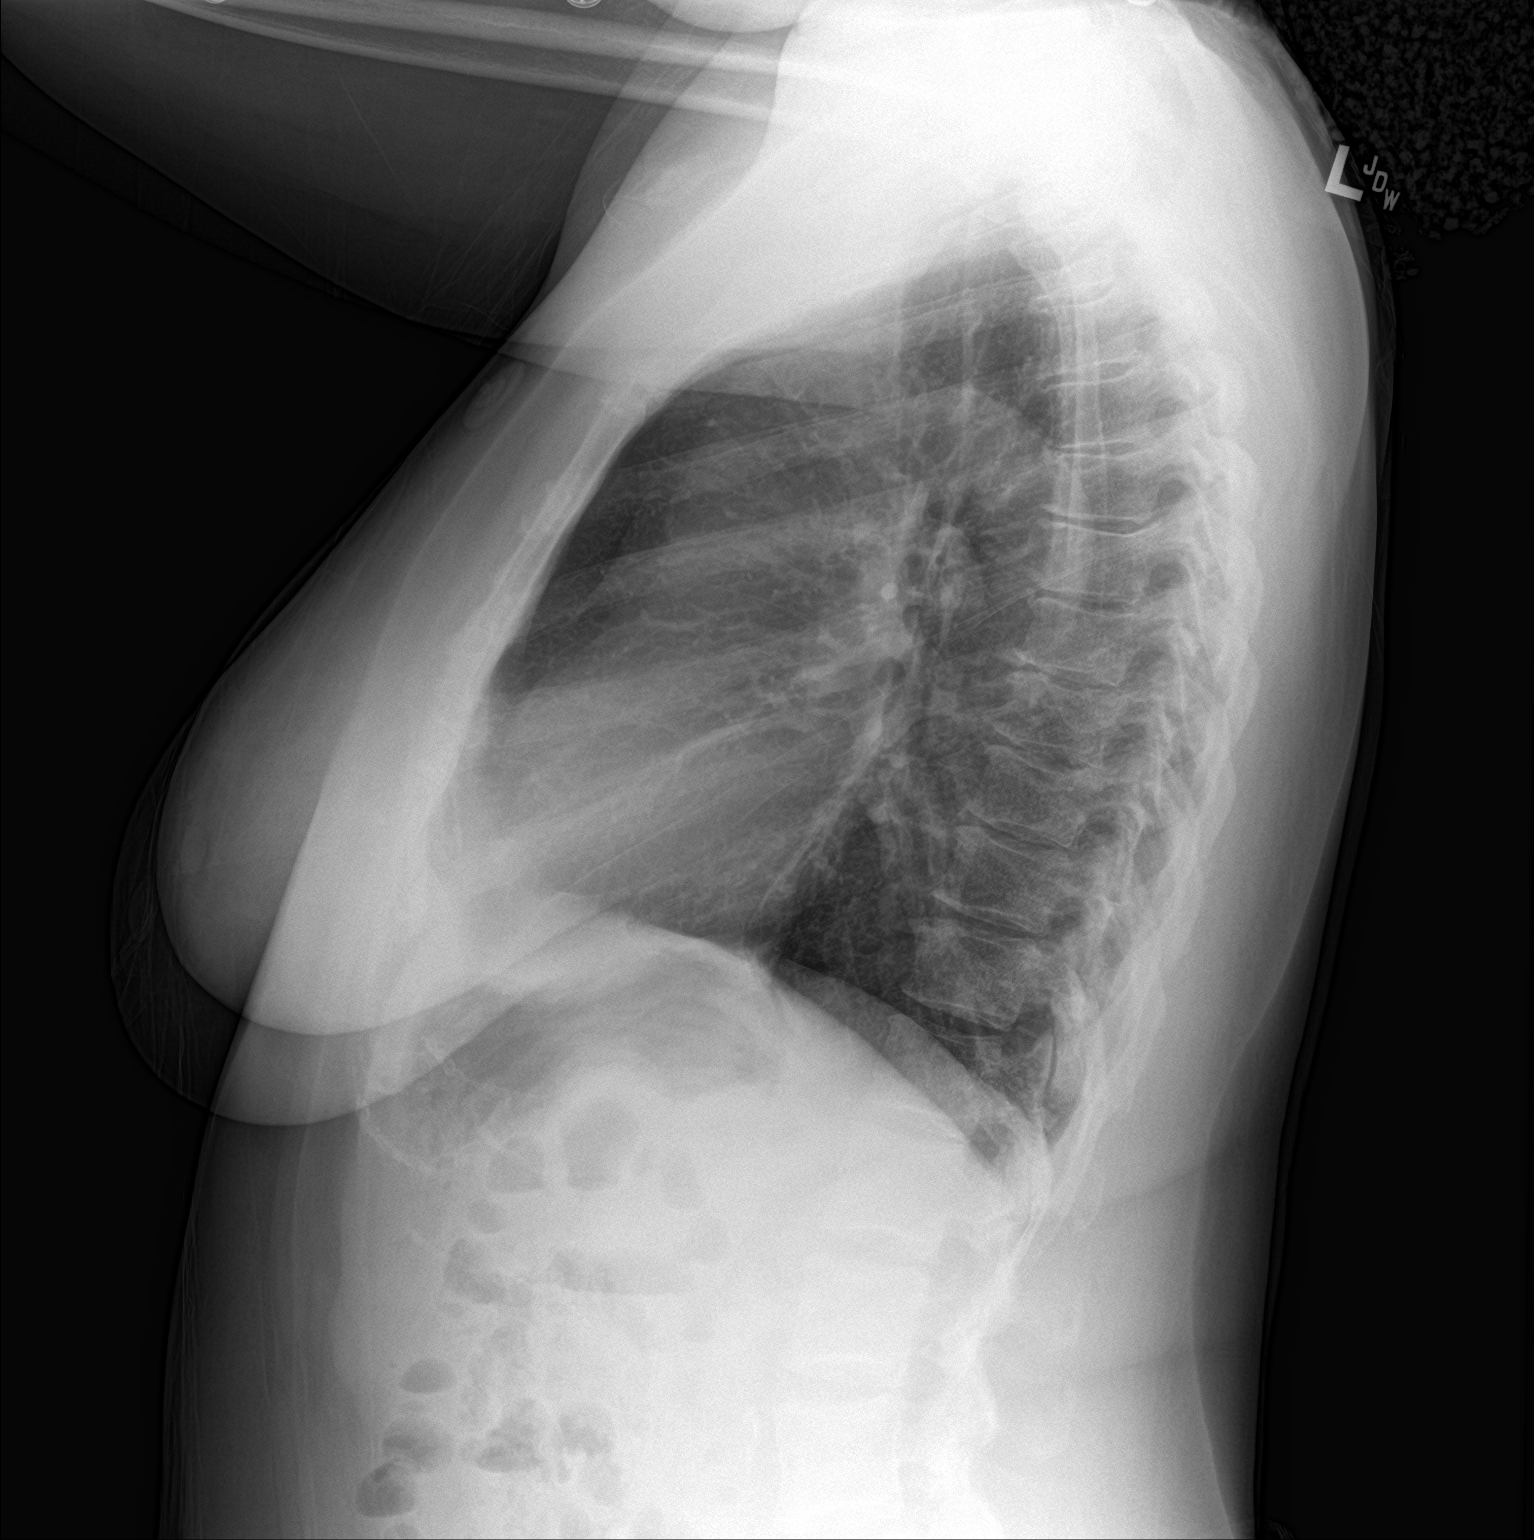

[2 of 2 positions shown; findings below may reference images not displayed]

FINDINGS: The cardiac and mediastinal silhouettes are stable in size and
contour, and remain within normal limits.

The lungs are normally inflated. No airspace consolidation, pleural
effusion, or pulmonary edema is identified. There is no
pneumothorax.

No acute osseous abnormality identified.
IMPRESSION: No active cardiopulmonary disease.

## 2019-03-11 ENCOUNTER — Encounter (HOSPITAL_COMMUNITY): Payer: Self-pay

## 2019-03-11 ENCOUNTER — Ambulatory Visit (INDEPENDENT_AMBULATORY_CARE_PROVIDER_SITE_OTHER): Payer: Self-pay

## 2019-03-11 ENCOUNTER — Ambulatory Visit (HOSPITAL_COMMUNITY)
Admission: EM | Admit: 2019-03-11 | Discharge: 2019-03-11 | Disposition: A | Discharge status: Home / Self Care | Payer: Self-pay | Attending: Family Medicine | Admitting: Family Medicine

## 2019-03-11 ENCOUNTER — Other Ambulatory Visit: Payer: Self-pay

## 2019-03-11 DIAGNOSIS — M25561 Pain in right knee: Secondary | ICD-10-CM

## 2019-03-11 DIAGNOSIS — M25461 Effusion, right knee: Secondary | ICD-10-CM

## 2019-03-11 HISTORY — DX: Essential (primary) hypertension: I10

## 2019-03-11 MED ORDER — MELOXICAM 7.5 MG PO TABS: 7.5000 mg | ORAL_TABLET | Freq: Every day | ORAL | 0 refills | Status: DC

## 2019-03-11 MED ORDER — OMEPRAZOLE 20 MG PO CPDR: 20.0000 mg | DELAYED_RELEASE_CAPSULE | Freq: Every day | ORAL | 0 refills | Status: DC

## 2019-03-11 NOTE — ED Triage Notes (Signed)
Patient presents to Urgent Care with complaints of right knee pain since 2 days ago. Patient reports she cannot think of what she did but it hurts too badly for her to return to work today.

## 2019-03-11 NOTE — Discharge Instructions (Signed)
Your knee does have some swelling and small amount of fluid, likely from strain and over use.  Ice, elevation, use of compression wrap.  Meloxicam daily for the next 2 weeks or until pain is better managed, take with food and take with omeprazole as well.  I would like you to follow up with orthopedics for recheck, especially if persistent and as you have had some longstanding issues.

## 2019-03-12 NOTE — ED Provider Notes (Signed)
[facility]    CSN: [id number] Arrival date & time: 03/11/19  1440      History   Chief Complaint Chief Complaint  Patient presents with  . Knee Pain    HPI Angel Kemp is a 49 y.o. female.   Angel Kemp presents with complaints of right knee pain. It felt off on 1/28 but worse pain on 1/30. She had been helping care for a dependent adult with a lot of lifting/twisting prior to onset. No specific known injury to it. Stabbing pain. Has applied ice and took ibuprofen last night, which didn't seem to help. She has used an ace wrap and compression brace which seem to help. She has noted swelling which has improved. She works for [employer] and is constantly on her feet. No numbness tingling or weekness. No redness or warmth. States when she was younger she has a history of her knees "dislocating." This has not occurred in what sounds like years. Has never seen orthopedics related to this.     ROS per HPI, negative if not otherwise mentioned.      Past Medical History:  Diagnosis Date  . Allergy   . Cough with hemoptysis 09/21/2011  . Family history of anesthesia complication    mother  . Goiter   . History of urinary urgency   . Hypertension   . Hyperthyroidism   . Mass in chest 09/21/2011  . No pertinent past medical history     Patient Active Problem List   Diagnosis Date Noted  . Chest pain 07/23/2012  . Thyroid nodule 09/22/2011  . Hyperthyroidism 09/22/2011  . Hemoptysis 09/21/2011  . 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 TUBAL LIGATION    . VIDEO BRONCHOSCOPY  09/23/2011   Procedure: VIDEO BRONCHOSCOPY WITHOUT FLUORO;  Surgeon: [name], MD;  Location: [facility];  Service: Cardiopulmonary;  Laterality: Bilateral;  . WISDOM TOOTH EXTRACTION      OB History   No obstetric history on file.      Home Medications    Prior to Admission medications   Medication Sig Start Date  End Date Taking? Authorizing Provider  atenolol (TENORMIN) 25 MG tablet Take 1 tablet (25 mg total) by mouth daily. 05/04/16   Bettey Costa, PA  benzonatate (TESSALON) 100 MG capsule Take 1 capsule (100 mg total) by mouth 3 (three) times daily as needed for cough. 05/04/16   Bettey Costa, PA  dextromethorphan-guaiFENesin (MUCINEX DM) 30-600 MG 12hr tablet Take 1 tablet by mouth 2 (two) times daily as needed for cough. 05/04/16   Espina, Kemper Durie, PA  fluticasone (FLONASE) 50 MCG/ACT nasal spray Place 2 sprays into both nostrils daily. 05/04/16   Bettey Costa, PA  meloxicam (MOBIC) 7.5 MG tablet Take 1 tablet (7.5 mg total) by mouth daily. 03/11/19   Zigmund Gottron, NP  methimazole (TAPAZOLE) 10 MG tablet Take 30 mg by mouth daily.    [provider]  methimazole (TAPAZOLE) 5 MG tablet TAKE 1 TABLET (5 MG TOTAL) BY MOUTH 3 (THREE) TIMES DAILY. Patient not taking: Reported on 05/11/2016 12/28/12   [name], MD  omeprazole (PRILOSEC) 20 MG capsule Take 1 capsule (20 mg total) by mouth daily. 03/11/19   Zigmund Gottron, NP  ondansetron (ZOFRAN) 4 MG tablet Take 1 tablet (4 mg total) by mouth every 6 (six) hours. 05/11/16   Orpah Greek, MD  pantoprazole (PROTONIX) 20 MG tablet Take  1 tablet (20 mg total) by mouth 2 (two) times daily. 05/11/16   Orpah Greek, MD    Family History Family History  Problem Relation Age of Onset  . Hypertension Mother   . Diabetes type II Mother     Social History Social History   Tobacco Use  . Smoking status: Former Smoker    Quit date: 07/07/2011    Years since quitting: 7.6  . Smokeless tobacco: Never Used  Substance Use Topics  . Alcohol use: No  . Drug use: No     Allergies   Latex   Review of Systems Review of Systems   Physical Exam Triage Vital Signs ED Triage Vitals  Enc Vitals Group     BP 03/11/19 1556 (!) 123/93     Pulse Rate 03/11/19 1556 80     Resp 03/11/19 1556  16     Temp 03/11/19 1556 98.4 F (36.9 C)     Temp Source 03/11/19 1556 Oral     SpO2 03/11/19 1556 99 %     Weight --      Height --      Head Circumference --      Peak Flow --      Pain Score 03/11/19 1553 6     Pain Loc --      Pain Edu? --      Excl. in [place]? --    No data found.  Updated Vital Signs BP (!) 123/93 (BP Location: Left Arm)   Pulse 80   Temp 98.4 F (36.9 C) (Oral)   Resp 16   SpO2 99%   Visual Acuity Right Eye Distance:   Left Eye Distance:   Bilateral Distance:    Right Eye Near:   Left Eye Near:    Bilateral Near:     Physical Exam Constitutional:      General: She is not in acute distress.    Appearance: She is well-developed.  Cardiovascular:     Rate and Rhythm: Normal rate.  Pulmonary:     Effort: Pulmonary effort is normal.  Musculoskeletal:     Right knee: Effusion present. No erythema, ecchymosis, bony tenderness or crepitus. Normal range of motion. Tenderness present over the patellar tendon. Normal alignment.     Comments: Right knee with mild effusion, pain with extension with limited pain with flexion; no obvious laxity noted with stress applied; noted pain with weight bearing with some alteration in gait; tenderness primarily to patellar tendon but mild to soft tissues of medial and lateral knee    Skin:    General: Skin is warm and dry.  Neurological:     Mental Status: She is alert and oriented to person, place, and time.      UC Treatments / Results  Labs (all labs ordered are listed, but only abnormal results are displayed) Labs Reviewed - No data to display  EKG   Radiology DG Knee AP/LAT W/Sunrise Right  Result Date: 03/11/2019 CLINICAL DATA:  Right knee pain for 2 days. No known injury. EXAM: RIGHT KNEE 3 VIEWS COMPARISON:  None. FINDINGS: The mineralization and alignment are normal. There is no evidence of acute fracture or dislocation. The joint spaces are preserved. There is a small knee joint effusion. There is  a patella alta without signs of recent transient patellar dislocation injury. IMPRESSION: No acute osseous findings. Small joint effusion. Electronically Signed   By: Richardean Sale M.D.   On: 03/11/2019 17:11    Procedures  Procedures (including critical care time)  Medications Ordered in UC Medications - No data to display  Initial Impression / Assessment and Plan / UC Course  I have reviewed the triage vital signs and the nursing notes.  Pertinent labs & imaging results that were available during my care of the patient were reviewed by me and considered in my medical decision making (see chart for details).     Xray without acute findings, appearing consistent with history and exam. No specific known injury. Ice, elevation, compression, nsaids for pain. Encouraged follow up with ortho or sports medicine for persistent symptoms. Patient verbalized understanding and agreeable to plan. Ambulatory out of clinic without difficulty.    Final Clinical Impressions(s) / UC Diagnoses   Final diagnoses:  Acute pain of right knee  Effusion of right knee     Discharge Instructions     Your knee does have some swelling and small amount of fluid, likely from strain and over use.  Ice, elevation, use of compression wrap.  Meloxicam daily for the next 2 weeks or until pain is better managed, take with food and take with omeprazole as well.  I would like you to follow up with orthopedics for recheck, especially if persistent and as you have had some longstanding issues.    ED Prescriptions    Medication Sig Dispense Auth. Provider   meloxicam (MOBIC) 7.5 MG tablet Take 1 tablet (7.5 mg total) by mouth daily. 20 tablet Augusto Gamble B, NP   omeprazole (PRILOSEC) 20 MG capsule Take 1 capsule (20 mg total) by mouth daily. 30 capsule Zigmund Gottron, NP     PDMP not reviewed this encounter.   Zigmund Gottron, NP 03/12/19 [phone]

## 2019-10-10 LAB — COLOGUARD

## 2019-11-29 LAB — COLOGUARD

## 2019-12-26 LAB — COLOGUARD: COLOGUARD: NEGATIVE

## 2020-06-24 ENCOUNTER — Encounter: Payer: Self-pay | Admitting: Family Medicine

## 2020-06-24 ENCOUNTER — Ambulatory Visit (INDEPENDENT_AMBULATORY_CARE_PROVIDER_SITE_OTHER): Payer: Managed Care, Other (non HMO) | Admitting: Family Medicine

## 2020-06-24 ENCOUNTER — Other Ambulatory Visit: Payer: Self-pay

## 2020-06-24 VITALS — BP 120/82 | HR 77 | Temp 98.0°F | Ht 64.0 in | Wt 186.6 lb

## 2020-06-24 DIAGNOSIS — E042 Nontoxic multinodular goiter: Secondary | ICD-10-CM | POA: Diagnosis not present

## 2020-06-24 DIAGNOSIS — N950 Postmenopausal bleeding: Secondary | ICD-10-CM | POA: Diagnosis not present

## 2020-06-24 DIAGNOSIS — E0500 Thyrotoxicosis with diffuse goiter without thyrotoxic crisis or storm: Secondary | ICD-10-CM | POA: Diagnosis not present

## 2020-06-24 DIAGNOSIS — L732 Hidradenitis suppurativa: Secondary | ICD-10-CM | POA: Insufficient documentation

## 2020-06-24 DIAGNOSIS — R229 Localized swelling, mass and lump, unspecified: Secondary | ICD-10-CM | POA: Diagnosis not present

## 2020-06-24 LAB — LUTEINIZING HORMONE: LH: 20.86 m[IU]/mL

## 2020-06-24 LAB — FOLLICLE STIMULATING HORMONE: FSH: 48.4 m[IU]/mL

## 2020-06-24 NOTE — Progress Notes (Signed)
[facility] [address] [place] [address] Dept: [phone] Dept Fax: [phone]  New Patient Office Visit  Subjective:    Patient ID: Angel Kemp, female    DOB: [birth year], 50 y.o..   MRN: [id number]  Chief Complaint  Patient presents with  . Establish Care    NP- Establish care. C/o having a period x 1 week after not having one for 2 years.       History of Present Illness:  Patient is in today to establish care. Angel Kemp is originally from [place], [place]. She has lived in [place] most of her life. She has a long-term S.O. (20 years). She has three children (33,30,29) and 10 grandchildren. 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 She quit smoking 10 years ago. She denies alcohol or drug use.  Angel Kemp has a history of Grave's disease with hyperthyroidism. She has been off of medication and her last T4 and TSH were normal (09/2019). She has been followed by Dr. Hartford Poli (endocrinology) for this. She also has had multiple thyroid nodules. These were stable on her last ultrasound. She is due to see Dr. Hartford Poli back in 11/2020 for follow-up of both conditions.  Angel Kemp has a history of some chronic nodules on her skin (right jaw and right neck). She was seen by dermatology and provided a cream for this. The cream was not effective for resolving her issue.  Angel Kemp notes she started to have vaginal bleeding in the past week. She had regular menstrual cycles in her early adulthood. By ~ age 56, she had stopped her menses. She recalls having menopausal symptoms around that time. These have since resolved. This is the first menstrual bleeding she has had since 2015. she notes her mother had a history of possibly cervical dysplasia or cancer. She had a cryotherapy initially and then ended up with a hysterectomy.  Angel Kemp has a history of bilateral shoulder pain that occasionally gives her trouble. She also has a  history of allergic rhinitis which she manages with Flonase nasal spray.  Past Medical History: Patient Active Problem List   Diagnosis Date Noted  . 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 Chronic midline low back pain without sciatica 04/12/2017  . Multiple thyroid nodules 10/27/2016  . Graves disease 10/27/2016  . Urge incontinence 06/02/2016  . Chest pain 07/23/2012  . Hemoptysis 09/21/2011  . 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 TUBAL LIGATION    . VIDEO BRONCHOSCOPY  09/23/2011   Procedure: VIDEO BRONCHOSCOPY WITHOUT FLUORO;  Surgeon: [name], MD;  Location: [facility];  Service: Cardiopulmonary;  Laterality: Bilateral;  . WISDOM TOOTH EXTRACTION     Family History  Problem Relation Age of Onset  . Hypertension Mother   . Diabetes type II Mother    Outpatient Medications Prior to Visit  Medication Sig Dispense Refill  . clindamycin (CLEOCIN) 75 MG/5ML solution Take by mouth 3 (three) times daily.    . fluticasone (FLONASE) 50 MCG/ACT nasal spray Place 2 sprays into both nostrils daily. 1 g 0  . meloxicam (MOBIC) 7.5 MG tablet Take 1 tablet (7.5 mg total) by mouth daily. 20 tablet 0  . atenolol (TENORMIN) 25 MG tablet Take 1 tablet (25 mg total) by mouth daily. (Patient not taking: Reported on 06/24/2020) 30 tablet 0  . benzonatate (TESSALON) 100 MG capsule Take 1 capsule (100 mg total) by mouth 3 (three) times daily as  needed for cough. (Patient not taking: Reported on 06/24/2020) 21 capsule 0  . dextromethorphan-guaiFENesin (MUCINEX DM) 30-600 MG 12hr tablet Take 1 tablet by mouth 2 (two) times daily as needed for cough. (Patient not taking: Reported on 06/24/2020) 12 tablet 0  . methimazole (TAPAZOLE) 10 MG tablet Take 30 mg by mouth daily. (Patient not taking: Reported on 06/24/2020)    . methimazole (TAPAZOLE) 5 MG tablet TAKE 1 TABLET (5 MG TOTAL) BY MOUTH 3 (THREE) TIMES DAILY. (Patient not taking: No sig  reported) 90 tablet 3  . omeprazole (PRILOSEC) 20 MG capsule Take 1 capsule (20 mg total) by mouth daily. (Patient not taking: Reported on 06/24/2020) 30 capsule 0  . ondansetron (ZOFRAN) 4 MG tablet Take 1 tablet (4 mg total) by mouth every 6 (six) hours. (Patient not taking: Reported on 06/24/2020) 12 tablet 0  . pantoprazole (PROTONIX) 20 MG tablet Take 1 tablet (20 mg total) by mouth 2 (two) times daily. (Patient not taking: Reported on 06/24/2020) 60 tablet 0   No facility-administered medications prior to visit.   Allergies  Allergen Reactions  . Latex Itching and Rash     Objective:   Today's Vitals   06/24/20 0936  BP: 120/82  Pulse: 77  Temp: 98 F (36.7 C)  TempSrc: Temporal  SpO2: 99%  Weight: 186 lb 9.6 oz (84.6 kg)  Height: 5\' 4"  (1.626 m)   Body mass index is 32.03 kg/m.   General: Well developed, well nourished. No acute distress. Skin: There are two nodules, each about 1 cm in size over the right lower jaw and right neck. There is no   erythema or induraiton and no drainage. Psych: Alert and oriented x3. Normal mood and affect.  Health Maintenance Due  Topic Date Due  . Hepatitis C Screening  Never done  . PAP SMEAR-Modifier  Never done  . COVID-19 Vaccine (3 - Booster for Moderna series) 10/23/2019   Assessment & Plan:   1. Graves disease Appears to be in remission off of methimazole. She will continue to follow with Dr. Hartford Poli.  2. Multiple thyroid nodules As above, these appear benign and are being monitored with annual ultrasound for now.  3. Multiple skin nodules Recommend MS. Simerson follows back up with her dermatologist in regards to next steps.  4. Postmenopausal bleeding Discussed with Ms. Hasten about the potentially worrisome nature of post-menopausal bleeding. I will check LH, FSH, and prolactin to assess for potential causes. I will also refer her to GYN for a possible endometrial biopsy to assess.  - [facility] - LH - Prolactin - Ambulatory  referral to Gynecology  Haydee Salter, MD

## 2020-06-25 LAB — PROLACTIN: Prolactin: 8.1 ng/mL

## 2020-07-21 ENCOUNTER — Ambulatory Visit: Payer: Self-pay | Admitting: Nurse Practitioner

## 2020-07-21 DIAGNOSIS — Z0289 Encounter for other administrative examinations: Secondary | ICD-10-CM

## 2020-09-15 ENCOUNTER — Ambulatory Visit: Payer: Managed Care, Other (non HMO) | Admitting: Nurse Practitioner

## 2020-09-15 DIAGNOSIS — Z0289 Encounter for other administrative examinations: Secondary | ICD-10-CM

## 2020-09-24 ENCOUNTER — Encounter: Payer: Managed Care, Other (non HMO) | Admitting: Family Medicine

## 2021-04-21 ENCOUNTER — Other Ambulatory Visit: Payer: Self-pay | Admitting: Internal Medicine

## 2021-04-21 DIAGNOSIS — Z1231 Encounter for screening mammogram for malignant neoplasm of breast: Secondary | ICD-10-CM

## 2021-05-17 ENCOUNTER — Other Ambulatory Visit: Payer: Self-pay | Admitting: Internal Medicine

## 2021-05-17 DIAGNOSIS — D241 Benign neoplasm of right breast: Secondary | ICD-10-CM

## 2021-05-26 ENCOUNTER — Other Ambulatory Visit: Payer: Managed Care, Other (non HMO)

## 2022-01-16 ENCOUNTER — Encounter (HOSPITAL_BASED_OUTPATIENT_CLINIC_OR_DEPARTMENT_OTHER): Payer: Self-pay

## 2022-01-16 ENCOUNTER — Emergency Department (HOSPITAL_BASED_OUTPATIENT_CLINIC_OR_DEPARTMENT_OTHER)
Admission: EM | Admit: 2022-01-16 | Discharge: 2022-01-16 | Disposition: A | Discharge status: Home / Self Care | Payer: Self-pay | Attending: Emergency Medicine | Admitting: Emergency Medicine

## 2022-01-16 ENCOUNTER — Emergency Department (HOSPITAL_BASED_OUTPATIENT_CLINIC_OR_DEPARTMENT_OTHER): Payer: Self-pay

## 2022-01-16 ENCOUNTER — Other Ambulatory Visit: Payer: Self-pay

## 2022-01-16 DIAGNOSIS — X501XXA Overexertion from prolonged static or awkward postures, initial encounter: Secondary | ICD-10-CM | POA: Insufficient documentation

## 2022-01-16 DIAGNOSIS — Z87891 Personal history of nicotine dependence: Secondary | ICD-10-CM | POA: Insufficient documentation

## 2022-01-16 DIAGNOSIS — Z91040 Latex allergy status: Secondary | ICD-10-CM | POA: Insufficient documentation

## 2022-01-16 DIAGNOSIS — S8991XA Unspecified injury of right lower leg, initial encounter: Secondary | ICD-10-CM | POA: Insufficient documentation

## 2022-01-16 DIAGNOSIS — I10 Essential (primary) hypertension: Secondary | ICD-10-CM | POA: Insufficient documentation

## 2022-01-16 DIAGNOSIS — Y990 Civilian activity done for income or pay: Secondary | ICD-10-CM | POA: Insufficient documentation

## 2022-01-16 MED ORDER — NAPROXEN 375 MG PO TABS: ORAL_TABLET | ORAL | 0 refills | Status: DC

## 2022-01-16 MED ORDER — NAPROXEN 250 MG PO TABS
500.0000 mg | ORAL_TABLET | Freq: Once | ORAL | Status: AC
  Administered 2022-01-16: 500 mg via ORAL
  Filled 2022-01-16: qty 2

## 2022-01-16 NOTE — ED Provider Notes (Signed)
[facility] Provider Note: Georgena Spurling, MD, FACEP  CSN: [id number] MRN: [id number] ARRIVAL: 01/16/22 at [place]: [place]  Knee Injury   HISTORY OF PRESENT ILLNESS  01/16/22 2:13 AM Angel Kemp is a 51 y.o. female who twisted her right knee at work about 11 PM yesterday evening.  She is not sure exactly which way it twisted.  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  She is having pain on the medial and lateral aspects of that knee.  She rates her pain as a 10 out of 10, worse with movement or palpation.  She is having difficulty bearing weight on it.   Past Medical History:  Diagnosis Date   Allergy    Cough with hemoptysis 09/21/2011   Goiter    History of urinary urgency    Hypertension    Hyperthyroidism    Mass in chest 09/21/2011    Past Surgical History:  Procedure Laterality Date   TUBAL LIGATION     VIDEO BRONCHOSCOPY  09/23/2011   Procedure: VIDEO BRONCHOSCOPY WITHOUT FLUORO;  Surgeon: [name], MD;  Location: [place];  Service: Cardiopulmonary;  Laterality: Bilateral;   WISDOM TOOTH EXTRACTION      Family History  Problem Relation Age of Onset   Hypertension Mother    Diabetes type II Mother     Social History   Tobacco Use   Smoking status: Former    Types: Cigarettes    Quit date: 07/07/2011    Years since quitting: 10.5   Smokeless tobacco: Never  Vaping Use   Vaping Use: Never used  Substance Use Topics   Alcohol use: No   Drug use: No    Prior to Admission medications   Medication Sig Start Date End Date Taking? Authorizing Provider  naproxen (NAPROSYN) 375 MG tablet Take 1 tablet twice daily as needed for knee pain. 01/16/22  Yes Thatiana Renbarger, MD    Allergies Latex   REVIEW OF SYSTEMS  Negative except as noted here or in the History of Present Illness.   PHYSICAL EXAMINATION  Initial Vital  Signs Blood pressure (!) 140/77, pulse 71, temperature 97.7 F (36.5 C), temperature source Tympanic, resp. rate 18, height '5\' 4"'$  (1.626 m), weight 89.8 kg, SpO2 99 %.  Examination General: Well-developed, well-nourished female in no acute distress; appearance consistent with age of record HENT: normocephalic; atraumatic Eyes: Normal appearance Neck: supple Heart: regular rate and rhythm Lungs: clear to auscultation bilaterally Abdomen: soft; nondistended; nontender; bowel sounds present Extremities: No deformity; medial and lateral tenderness of right knee without effusion, swelling or ecchymosis, pain on anterior drawer test, knee grossly stable, right lower extremity distally neurovascularly intact Neurologic: Awake, alert and oriented; motor function intact in all extremities and symmetric; no facial droop Skin: Warm and dry Psychiatric: Normal mood and affect   RESULTS  Summary of this visit's results, reviewed and interpreted by myself:   EKG Interpretation  Date/Time:    Ventricular Rate:    PR Interval:    QRS Duration:   QT Interval:    QTC Calculation:   R Axis:     Text Interpretation:         Laboratory Studies: No results found for this or any previous visit (from the past 24 hour(s)). Imaging Studies: DG Knee Complete 4 Views Right  Result Date: 01/16/2022 CLINICAL DATA:  Twisted knee and  fell.  Right knee pain. EXAM: RIGHT KNEE - COMPLETE 4+ VIEW COMPARISON:  03/11/2019. FINDINGS: No acute fracture or dislocation. Joint space is maintained at the knee. There is a small suprapatellar joint effusion. The soft tissues are unremarkable. IMPRESSION: 1. No acute osseous abnormality. 2. Small suprapatellar joint effusion. Electronically Signed   By: Brett Fairy M.D.   On: 01/16/2022 02:29    ED COURSE and MDM  Nursing notes, initial and subsequent vitals signs, including pulse oximetry, reviewed and interpreted by myself.  Vitals:   01/16/22 0152 01/16/22 0153   BP: (!) 140/77   Pulse: 71   Resp: 18   Temp: 97.7 F (36.5 C)   TempSrc: Tympanic   SpO2: 99%   Weight:  89.8 kg  Height:  '5\' 4"'$  (1.626 m)   Medications  naproxen (NAPROSYN) tablet 500 mg (has no administration in time range)   Patient's knee is stable and there is no bony injury seen on radiograph.  We will place her in a knee immobilizer and crutches and refer to orthopedics if symptoms persist.   PROCEDURES  Procedures   ED DIAGNOSES     ICD-10-CM   1. Right knee injury, initial encounter  S89.91XA          Ramiya Delahunty, Jenny Reichmann, MD 01/16/22 [phone]

## 2022-01-16 NOTE — ED Triage Notes (Signed)
Pt reports while at work twisted her knee and fell. Now has pain in right knee. Pulse, motor, and sensory in tact distal to injury

## 2022-01-17 ENCOUNTER — Telehealth: Payer: Self-pay

## 2022-01-17 NOTE — Telephone Encounter (Signed)
Transition Care Management Unsuccessful Follow-up Telephone Call  Date of discharge and from where:  01/16/22 [facility]. Dx: Right knee injury  Attempts:  1st Attempt  Reason for unsuccessful TCM follow-up call:  Left voice message

## 2022-01-18 ENCOUNTER — Telehealth: Payer: Self-pay

## 2022-01-18 NOTE — Telephone Encounter (Signed)
Transition Care Management Follow-up Telephone Call Date of discharge and from where: 01/16/22 How have you been since you were released from the hospital? I'm still in pain. Any questions or concerns? No  Items Reviewed: Did the pt receive and understand the discharge instructions provided? Yes  Medications obtained and verified? Yes  Other? No  Any new allergies since your discharge? No  Dietary orders reviewed? No Do you have support at home? Yes   Home Care and Equipment/Supplies: Were home health services ordered? not applicable If so, what is the name of the agency? N/a  Has the agency set up a time to come to the patient's home? not applicable Were any new equipment or medical supplies ordered?  No What is the name of the medical supply agency? N/a Were you able to get the supplies/equipment? not applicable Do you have any questions related to the use of the equipment or supplies? No  Functional Questionnaire: (I = Independent and D = Dependent) ADLs: I  Bathing/Dressing- I  Meal Prep- I  Eating- I  Maintaining continence- I  Transferring/Ambulation- I  Managing Meds- I  Follow up appointments reviewed:  PCP Hospital f/u appt confirmed? No  Scheduled to see N/A on N/A @ N/A. [facility] f/u appt confirmed? Yes  Scheduled to see Dr. Griffin Basil w/ Ortho on 01/18/22 @ 1:30pm. Are transportation arrangements needed? No  If their condition worsens, is the pt aware to call PCP or go to the Emergency Dept.? Yes Was the patient provided with contact information for the PCP's office or ED? Yes Was to pt encouraged to call back with questions or concerns? Yes

## 2022-01-18 NOTE — Telephone Encounter (Signed)
Transition Care Management Follow-up Telephone Call Date of discharge and from where: 01/16/2022 [facility] How have you been since you were released from the hospital? Leg still in pain Any questions or concerns? No  Items Reviewed: Did the pt receive and understand the discharge instructions provided? Yes  Medications obtained and verified? Yes  Other? No  Any new allergies since your discharge? No  Dietary orders reviewed? Yes Do you have support at home? Yes   Home Care and Equipment/Supplies: Were home health services ordered? not applicable If so, what is the name of the agency? N/a  Has the agency set up a time to come to the patient's home? not applicable Were any new equipment or medical supplies ordered?  No What is the name of the medical supply agency? N/a Were you able to get the supplies/equipment? not applicable Do you have any questions related to the use of the equipment or supplies? No  Functional Questionnaire: (I = Independent and D = Dependent) ADLs: I  Bathing/Dressing- I  Meal Prep- I  Eating- I  Maintaining continence- I  Transferring/Ambulation- I  Managing Meds- I  Follow up appointments reviewed:  PCP Hospital f/u appt confirmed? No  States she is to follow up with Dr. Griffin Basil. She will call to schedule an appointment after seeing him it needed. Are transportation arrangements needed? No  If their condition worsens, is the pt aware to call PCP or go to the Emergency Dept.? Yes Was the patient provided with contact information for the PCP's office or ED? Yes Was to pt encouraged to call back with questions or concerns? Yes

## 2022-09-12 ENCOUNTER — Encounter: Payer: Self-pay | Admitting: Family Medicine

## 2022-09-12 ENCOUNTER — Telehealth (INDEPENDENT_AMBULATORY_CARE_PROVIDER_SITE_OTHER): Payer: Managed Care, Other (non HMO) | Admitting: Family Medicine

## 2022-09-12 VITALS — Ht 64.0 in | Wt 193.0 lb

## 2022-09-12 DIAGNOSIS — U071 COVID-19: Secondary | ICD-10-CM | POA: Diagnosis not present

## 2022-09-12 MED ORDER — NIRMATRELVIR/RITONAVIR (PAXLOVID)TABLET: 3.0000 | ORAL_TABLET | Freq: Two times a day (BID) | ORAL | 0 refills | Status: AC

## 2022-09-12 NOTE — Progress Notes (Signed)
[facility] [address] [place] [address] Dept: [phone] Dept Fax: [phone]  Virtual Video Visit  I connected with Collene Schlichter on 09/12/22 at  9:20 AM EDT by a video enabled telemedicine application and verified that I am speaking with the correct person using two identifiers.  Location patient: Home Location provider: Clinic Persons participating in the virtual visit: Patient, Provider  I discussed the limitations of evaluation and management by telemedicine and the availability of in person appointments. The patient expressed understanding and agreed to proceed.  Chief Complaint  Patient presents with   Cough    C/o having body aches, runny nose, cough, lost taste, and diarrhea x 4 days.  Has taken Zicam.   Positive home Covid test.     SUBJECTIVE:  HPI: Angel Kemp is a 52 y.o. female who presents with COVID-19. She notes that she has some co-workers that had recently been sick. She developed some diarrhea on Thursday (8/1). Over Friday night, she began having cough and congestion. When she got home form work on Sat., she noted chills and a runny nose. She had a temp of 102 F. She slept most of that day. Yesterday, she did a home COVID test, which was positive. She has been using Zicam. She lives with her mother and has been concerned about passing this infection to her. She admits to feeling some dyspnea at rest, but notes it is not too bothersome.  Patient Active Problem List   Diagnosis Date Noted   Hidradenitis axillaris 06/24/2020   Chronic midline low back pain without sciatica 04/12/2017   Multiple thyroid nodules 10/27/2016   Graves disease 10/27/2016   Urge incontinence 06/02/2016   Chest pain 07/23/2012   Hemoptysis 09/21/2011   Mediastinal mass 09/21/2011   Past Surgical History:  Procedure Laterality Date   TUBAL LIGATION     VIDEO BRONCHOSCOPY  09/23/2011   Procedure: VIDEO  BRONCHOSCOPY WITHOUT FLUORO;  Surgeon: Lonia Farber, MD;  Location: [facility] ENDOSCOPY;  Service: Cardiopulmonary;  Laterality: Bilateral;   WISDOM TOOTH EXTRACTION     Family History  Problem Relation Age of Onset   Hypertension Mother    Diabetes type II Mother    Social History   Tobacco Use   Smoking status: Former    Current packs/day: 0.00    Types: Cigarettes    Quit date: 07/07/2011    Years since quitting: 11.1   Smokeless tobacco: Never  Vaping Use   Vaping status: Never Used  Substance Use Topics   Alcohol use: No   Drug use: No    Current Outpatient Medications:    naproxen (NAPROSYN) 375 MG tablet, Take 1 tablet twice daily as needed for knee pain. (Patient not taking: Reported on 09/12/2022), Disp: 20 tablet, Rfl: 0 Allergies  Allergen Reactions   Latex Itching and Rash   ROS: See pertinent positives and negatives per HPI.  OBSERVATIONS/OBJECTIVE:  VITALS per patient if applicable: Today's Vitals   09/12/22 0913  Weight: 193 lb (87.5 kg)  Height: 5\' 4"  (1.626 m)   Body mass index is 33.13 kg/m.   GENERAL: Alert and oriented. Appears well and in no acute distress. LUNGS: On inspection, no signs of respiratory distress. Breathing rate appears normal. No obvious gross SOB, gasping or wheezing, and no conversational dyspnea. PSYCH/NEURO: Pleasant and cooperative. No obvious depression or anxiety. Speech and thought processing grossly intact.  ASSESSMENT AND PLAN:  Problem List Items Addressed This Visit  Other   COVID-19 - Primary    Reviewed home care instructions for COVID, including rest, pushing fluids, and OTC medications as needed for symptom relief. Recommend hot tea with honey for sore throat symptoms. I will prescribe Paxlovid. Advised self-isolation at home until 24 hours after fever resolved and symptoms improve. Continue to wear a mask around others for an additional 5 days. I recommend she try and test her pulse-ox at home. If her  O2 sat is <92%,I recommend in-person evaluation at either an urgent care or the emergency room.       Relevant Medications   nirmatrelvir/ritonavir (PAXLOVID) 20 x 150 MG & 10 x 100MG  TABS   I discussed the assessment and treatment plan with the patient. The patient was provided an opportunity to ask questions and all were answered. The patient agreed with the plan and demonstrated an understanding of the instructions.   The patient was advised to call back or seek an in-person evaluation if the symptoms worsen or if the condition fails to improve as anticipated.  Return if symptoms worsen or fail to improve.   Loyola Mast, MD

## 2022-09-12 NOTE — Assessment & Plan Note (Signed)
Reviewed home care instructions for COVID, including rest, pushing fluids, and OTC medications as needed for symptom relief. Recommend hot tea with honey for sore throat symptoms. I will prescribe Paxlovid. Advised self-isolation at home until 24 hours after fever resolved and symptoms improve. Continue to wear a mask around others for an additional 5 days. I recommend she try and test her pulse-ox at home. If her O2 sat is <92%,I recommend in-person evaluation at either an urgent care or the emergency room.

## 2022-11-18 ENCOUNTER — Ambulatory Visit: Payer: Managed Care, Other (non HMO) | Admitting: Family Medicine

## 2022-11-18 ENCOUNTER — Encounter: Payer: Self-pay | Admitting: Family Medicine

## 2022-11-18 VITALS — BP 130/78 | HR 69 | Temp 97.7°F | Ht 64.0 in | Wt 188.0 lb

## 2022-11-18 DIAGNOSIS — M19011 Primary osteoarthritis, right shoulder: Secondary | ICD-10-CM | POA: Insufficient documentation

## 2022-11-18 DIAGNOSIS — M24812 Other specific joint derangements of left shoulder, not elsewhere classified: Secondary | ICD-10-CM | POA: Diagnosis not present

## 2022-11-18 DIAGNOSIS — Z1231 Encounter for screening mammogram for malignant neoplasm of breast: Secondary | ICD-10-CM

## 2022-11-18 DIAGNOSIS — Z23 Encounter for immunization: Secondary | ICD-10-CM

## 2022-11-18 DIAGNOSIS — G5603 Carpal tunnel syndrome, bilateral upper limbs: Secondary | ICD-10-CM | POA: Diagnosis not present

## 2022-11-18 DIAGNOSIS — Z1211 Encounter for screening for malignant neoplasm of colon: Secondary | ICD-10-CM

## 2022-11-18 DIAGNOSIS — M19012 Primary osteoarthritis, left shoulder: Secondary | ICD-10-CM | POA: Insufficient documentation

## 2022-11-18 MED ORDER — NAPROXEN 500 MG PO TABS: 500.0000 mg | ORAL_TABLET | Freq: Two times a day (BID) | ORAL | 0 refills | Status: DC

## 2022-11-18 NOTE — Assessment & Plan Note (Signed)
I suspect a possible labral tear. She may also have some rotator cuff tendinitis as well. Suspect her work typing contributes. I will refer to orthopedics to evaluate the popping in the joint.

## 2022-11-18 NOTE — Progress Notes (Signed)
[facility] [address] [place] [address] Dept: [phone] Dept Fax: [phone]  Office Visit  Subjective:    Patient ID: Angel Kemp, female    DOB: Feb 18, 1970, 52 y.o..   MRN: [id number]  Chief Complaint  Patient presents with   Knee Pain    Co having knee, shoulder, arm, hand pain x 7-8 months. Has taken Tylenol    History of Present Illness:  Patient is in today complaining of pain and popping in both of her shoulders for about the past 8 months. She denies any prior shoulder injuries. The left shoulder seems more involved that the right. She has also noted some numbness of the hands with pain radiating up the forearms over a similar time period. She admits to awakening at night with hand numbness. She works for [employer] and does quite a bit of typing each day.  Past Medical History: Patient Active Problem List   Diagnosis Date Noted   Hidradenitis axillaris 06/24/2020   Chronic midline low back pain without sciatica 04/12/2017   Multiple thyroid nodules 10/27/2016   Graves disease 10/27/2016   Urge incontinence 06/02/2016   Chest pain 07/23/2012   Hemoptysis 09/21/2011   Mediastinal mass 09/21/2011   Past Surgical History:  Procedure Laterality Date   TUBAL LIGATION     VIDEO BRONCHOSCOPY  09/23/2011   Procedure: VIDEO BRONCHOSCOPY WITHOUT FLUORO;  Surgeon: Lonia Farber, MD;  Location: [facility] ENDOSCOPY;  Service: Cardiopulmonary;  Laterality: Bilateral;   WISDOM TOOTH EXTRACTION     Family History  Problem Relation Age of Onset   Hypertension Mother    Diabetes type II Mother    Outpatient Medications Prior to Visit  Medication Sig Dispense Refill   acetaminophen (TYLENOL) 325 MG tablet Take 650 mg by mouth every 6 (six) hours as needed.     No facility-administered medications prior to visit.   Allergies  Allergen Reactions   Latex Itching and Rash     Objective:   Today's Vitals    11/18/22 1051  BP: 130/78  Pulse: 69  Temp: 97.7 F (36.5 C)  TempSrc: Temporal  SpO2: 99%  Weight: 188 lb (85.3 kg)  Height: 5\' 4"  (1.626 m)   Body mass index is 32.27 kg/m.   General: Well developed, well nourished. No acute distress. Extremities: Full ROM of shoulders. There is some apprehension with testing for left shoulder   stability.  No joint swelling. There is some tenderness alogn the glenohumeral joitn line. Empty can   testing shows normal strength of the left rotator cugg but mild tenderness. While performing this,   there was an audible pop of the left shoudler. Patient felt some relief from her priro paina nd   apprehension. Psych: Alert and oriented. Normal mood and affect.  Health Maintenance Due  Topic Date Due   Hepatitis C Screening  Never done   Cervical Cancer Screening (HPV/Pap Cotest)  Never done   Colonoscopy  Never done   Lung Cancer Screening  08/11/2019   MAMMOGRAM  09/20/2021   INFLUENZA VACCINE  Never done     Assessment & Plan:   Problem List Items Addressed This Visit       Nervous and Auditory   Bilateral carpal tunnel syndrome    Reviewed cause of numbness. I recommend she use splints at night to relive symptoms. I will prescribe a 14-day course of naproxen to see if this helps to resolve her numbness.  Relevant Medications   naproxen (NAPROSYN) 500 MG tablet     Musculoskeletal and Integument   Internal derangement of left shoulder - Primary    I suspect a possible labral tear. She may also have some rotator cuff tendinitis as well. Suspect her work typing contributes. I will refer to orthopedics to evaluate the popping in the joint.       Relevant Medications   naproxen (NAPROSYN) 500 MG tablet   Other Relevant Orders   Ambulatory referral to Orthopedics   Other Visit Diagnoses     Screening for colon cancer       Relevant Orders   Ambulatory referral to Gastroenterology   Encounter for screening mammogram for  malignant neoplasm of breast       Relevant Orders   MM DIGITAL SCREENING BILATERAL   Need for immunization against influenza       Relevant Orders   Flu vaccine trivalent PF, 6mos and older(Flulaval,Afluria,Fluarix,Fluzone) (Completed)       Return in about 3 weeks (around 12/09/2022) for Reassessment, pap smear.   Loyola Mast, MD

## 2022-11-18 NOTE — Assessment & Plan Note (Signed)
Reviewed cause of numbness. I recommend she use splints at night to relive symptoms. I will prescribe a 14-day course of naproxen to see if this helps to resolve her numbness.

## 2022-11-28 ENCOUNTER — Other Ambulatory Visit (INDEPENDENT_AMBULATORY_CARE_PROVIDER_SITE_OTHER): Payer: Self-pay

## 2022-11-28 ENCOUNTER — Ambulatory Visit (INDEPENDENT_AMBULATORY_CARE_PROVIDER_SITE_OTHER): Payer: Managed Care, Other (non HMO) | Admitting: Physician Assistant

## 2022-11-28 ENCOUNTER — Encounter: Payer: Self-pay | Admitting: Physician Assistant

## 2022-11-28 ENCOUNTER — Other Ambulatory Visit (INDEPENDENT_AMBULATORY_CARE_PROVIDER_SITE_OTHER): Payer: Managed Care, Other (non HMO)

## 2022-11-28 DIAGNOSIS — M25511 Pain in right shoulder: Secondary | ICD-10-CM

## 2022-11-28 DIAGNOSIS — M25512 Pain in left shoulder: Secondary | ICD-10-CM

## 2022-11-28 DIAGNOSIS — R200 Anesthesia of skin: Secondary | ICD-10-CM

## 2022-11-28 DIAGNOSIS — M5412 Radiculopathy, cervical region: Secondary | ICD-10-CM

## 2022-11-28 MED ORDER — METHYLPREDNISOLONE 4 MG PO TABS: ORAL_TABLET | ORAL | 0 refills | Status: DC

## 2022-11-28 MED ORDER — METHOCARBAMOL 500 MG PO TABS: 500.0000 mg | ORAL_TABLET | Freq: Every evening | ORAL | 1 refills | Status: DC

## 2022-11-28 NOTE — Addendum Note (Signed)
Addended by: Mardene Celeste B on: 11/28/2022 04:01 PM   Modules accepted: Orders

## 2022-11-28 NOTE — Progress Notes (Signed)
Office Visit Note   Patient: Angel Kemp           Date of Birth: 12-05-1970           MRN: [id number] Visit Date: 11/28/2022              Requested by: Loyola Mast, MD [address] [place],  [place] [address] PCP: Loyola Mast, MD   Assessment & Plan: Visit Diagnoses:  1. Acute pain of right shoulder   2. Acute pain of left shoulder   3. Radiculopathy, cervical region   4. Bilateral hand numbness     Plan: Will have her undergo EMG nerve conduction studies to rule out carpal tunnel syndrome bilaterally.  Will send her to formal physical therapy for her neck they will include range of motion modalities and home exercise program.  She will follow-up with Dr. Raye Sorrow after EMG nerve conduction studies to go over results and discuss further treatment.  Questions encouraged and answered at length.  Follow-Up Instructions: Return for After EMG/NCS.   Orders:  Orders Placed This Encounter  Procedures   XR Shoulder Right   XR Shoulder Left   XR Cervical Spine 2 or 3 views   Meds ordered this encounter  Medications   methocarbamol (ROBAXIN) 500 MG tablet    Sig: Take 1 tablet (500 mg total) by mouth at bedtime.    Dispense:  30 tablet    Refill:  1   methylPREDNISolone (MEDROL) 4 MG tablet    Sig: Take as directed    Dispense:  21 tablet    Refill:  0      Procedures: No procedures performed   Clinical Data: No additional findings.   Subjective: Chief Complaint  Patient presents with   Right Shoulder - Pain   Left Shoulder - Pain    HPI Patient is a 52 year old female we were seen for the first time.  She comes in today with bilateral shoulder pain.  She also has neck pain and numbness tingling in her hands.  She states that she has popping in her shoulders and tingling that goes down to her elbow and then tingling into her fingers that causes her to drop objects.  This been ongoing for approximately 6 months no known injury.  She does have to  shake her hands whenever she wakes up in the morning due to the numbness tingling.  She works at [employer] does work on [employer] and does a lot of repetitive work.  She has tried ibuprofen with no relief.  Also warm cloth around her neck and shoulders.  She has had no treatment otherwise.  Review of Systems Negative for fevers chills.  Otherwise see HPI.  Objective: Vital Signs: There were no vitals taken for this visit.  Physical Exam Constitutional:      Appearance: She is not ill-appearing or diaphoretic.  Cardiovascular:     Pulses: Normal pulses.  Pulmonary:     Effort: Pulmonary effort is normal.  Neurological:     Mental Status: She is alert and oriented to person, place, and time.  Psychiatric:        Mood and Affect: Mood normal.     Ortho Exam Cervical spine she has full forward flexion limited extension with some discomfort.  Positive Spurling's.  Tenderness medial border of the scapula bilaterally. Bilateral upper extremities 5 out of 5 strength throughout including rotator cuff testing which is negative throughout.  Negative abduction test bilateral shoulders.  Bilateral shoulders full overhead activity and external rotation without pain.  Impingement testing is negative.  There is slight crepitus both shoulders with passive range of motion.  Radial pulses 2+ and equal symmetric bilaterally.  Full motor bilateral hands.  Normal sensation throughout both hands to light touch.  Tinel's over the median nerve at the wrist is positive on the right mixed on the left.  Phalen's test is negative bilaterally.  Specialty Comments:  No specialty comments available.  Imaging: XR Shoulder Left  Result Date: 11/28/2022 Left shoulder 3 views: Shoulder is well located.  No acute fractures or acute findings.  Mild glenohumeral joint changes.  AC joint mild to moderate changes.  XR Shoulder Right  Result Date: 11/28/2022 Right shoulder 3 views: Shoulder is well located.  No acute  fractures.  Mild to moderate AC joint changes.  Mild glenohumeral joint changes.  No acute findings otherwise.  XR Cervical Spine 2 or 3 views  Result Date: 11/28/2022 Cervical spine 2 views: No acute fractures.  No spondylolisthesis.  C3-C6 mild anterior vertebral body spurring.  Loss of lordotic curvature.  Otherwise no acute findings    PMFS History: Patient Active Problem List   Diagnosis Date Noted   Bilateral carpal tunnel syndrome 11/18/2022   Internal derangement of left shoulder 11/18/2022   Hidradenitis axillaris 06/24/2020   Chronic midline low back pain without sciatica 04/12/2017   Multiple thyroid nodules 10/27/2016   Graves disease 10/27/2016   Urge incontinence 06/02/2016   Chest pain 07/23/2012   Hemoptysis 09/21/2011   Mediastinal mass 09/21/2011   Past Medical History:  Diagnosis Date   Allergy    Cough with hemoptysis 09/21/2011   Goiter    History of urinary urgency    Hypertension    Hyperthyroidism    Mass in chest 09/21/2011    Family History  Problem Relation Age of Onset   Hypertension Mother    Diabetes type II Mother     Past Surgical History:  Procedure Laterality Date   TUBAL LIGATION     VIDEO BRONCHOSCOPY  09/23/2011   Procedure: VIDEO BRONCHOSCOPY WITHOUT FLUORO;  Surgeon: Lonia Farber, MD;  Location: [facility] ENDOSCOPY;  Service: Cardiopulmonary;  Laterality: Bilateral;   WISDOM TOOTH EXTRACTION     Social History   Occupational History   Not on file  Tobacco Use   Smoking status: Former    Current packs/day: 0.00    Types: Cigarettes    Quit date: 07/07/2011    Years since quitting: 11.4   Smokeless tobacco: Never  Vaping Use   Vaping status: Never Used  Substance and Sexual Activity   Alcohol use: No   Drug use: No   Sexual activity: Yes

## 2022-12-05 ENCOUNTER — Encounter: Payer: Self-pay | Admitting: Internal Medicine

## 2022-12-06 ENCOUNTER — Ambulatory Visit: Payer: Managed Care, Other (non HMO) | Admitting: Physical Medicine and Rehabilitation

## 2022-12-06 DIAGNOSIS — R202 Paresthesia of skin: Secondary | ICD-10-CM

## 2022-12-06 NOTE — Procedures (Signed)
EMG & NCV Findings: All nerve conduction studies (as indicated in the following tables) were within normal limits.  All left vs. right side differences were within normal limits.    All examined muscles (as indicated in the following table) showed no evidence of electrical instability.    Impression: Essentially NORMAL electrodiagnostic study of both upper limbs.  There is no significant electrodiagnostic evidence of nerve entrapment, brachial plexopathy, cervical radiculopathy or generalized peripheral neuropathy.    As you know, purely sensory or demyelinating radiculopathies and chemical radiculitis may not be detected with this particular electrodiagnostic study.  Recommendations: 1.  Follow-up with referring physician. 2.  Continue current management of symptoms.  ___________________________ Angel Kemp FAAPMR Board Certified, American Board of Physical Medicine and Rehabilitation    Nerve Conduction Studies Anti Sensory Summary Table   Stim Site NR Peak (ms) Norm Peak (ms) P-T Amp (V) Norm P-T Amp Site1 Site2 Delta-P (ms) Dist (cm) Vel (m/s) Norm Vel (m/s)  Left Median Acr Palm Anti Sensory (2nd Digit)  30.3C  Wrist    3.1 <3.6 31.4 >10 Wrist Palm 1.5 0.0    Palm    1.6 <2.0 31.0         Right Median Acr Palm Anti Sensory (2nd Digit)  30.2C  Wrist    3.0 <3.6 41.4 >10 Wrist Palm 1.3 0.0    Palm    1.7 <2.0 28.9         Left Radial Anti Sensory (Base 1st Digit)  30.6C  Wrist    2.0 <3.1 47.4  Wrist Base 1st Digit 2.0 0.0    Right Radial Anti Sensory (Base 1st Digit)  30.3C  Wrist    1.9 <3.1 37.0  Wrist Base 1st Digit 1.9 0.0    Left Ulnar Anti Sensory (5th Digit)  31C  Wrist    3.0 <3.7 34.1 >15.0 Wrist 5th Digit 3.0 14.0 47 >38  Right Ulnar Anti Sensory (5th Digit)  30.7C  Wrist    3.0 <3.7 29.3 >15.0 Wrist 5th Digit 3.0 14.0 47 >38   Motor Summary Table   Stim Site NR Onset (ms) Norm Onset (ms) O-P Amp (mV) Norm O-P Amp Site1 Site2 Delta-0 (ms) Dist (cm) Vel  (m/s) Norm Vel (m/s)  Left Median Motor (Abd Poll Brev)  30.9C  Wrist    3.1 <4.2 8.6 >5 Elbow Wrist 3.8 21.0 55 >50  Elbow    6.9  8.5         Right Median Motor (Abd Poll Brev)  30.3C  Wrist    3.0 <4.2 9.1 >5 Elbow Wrist 4.0 21.5 54 >50  Elbow    7.0  9.0         Left Ulnar Motor (Abd Dig Min)  31C  Wrist    2.9 <4.2 6.5 >3 B Elbow Wrist 3.0 18.5 62 >53  B Elbow    5.9  6.3  A Elbow B Elbow 1.4 10.0 71 >53  A Elbow    7.3  6.2         Right Ulnar Motor (Abd Dig Min)  30.5C  Wrist    2.7 <4.2 6.1 >3 B Elbow Wrist 3.2 19.0 59 >53  B Elbow    5.9  6.4  A Elbow B Elbow 1.3 10.0 77 >53  A Elbow    7.2  5.8          EMG   Side Muscle Nerve Root Ins Act Fibs Psw Amp Dur Poly Recrt Int Dennie Bible  Comment  Left 1stDorInt Ulnar C8-T1 Nml Nml Nml Nml Nml 0 Nml Nml   Left Abd Poll Brev Median C8-T1 Nml Nml Nml Nml Nml 0 Nml Nml   Left ExtDigCom   Nml Nml Nml Nml Nml 0 Nml Nml   Left Triceps Radial C6-7-8 Nml Nml Nml Nml Nml 0 Nml Nml   Left Deltoid Axillary C5-6 Nml Nml Nml Nml Nml 0 Nml Nml     Nerve Conduction Studies Anti Sensory Left/Right Comparison   Stim Site L Lat (ms) R Lat (ms) L-R Lat (ms) L Amp (V) R Amp (V) L-R Amp (%) Site1 Site2 L Vel (m/s) R Vel (m/s) L-R Vel (m/s)  Median Acr Palm Anti Sensory (2nd Digit)  30.3C  Wrist 3.1 3.0 0.1 31.4 41.4 24.2 Wrist Palm     Palm 1.6 1.7 0.1 31.0 28.9 6.8       Radial Anti Sensory (Base 1st Digit)  30.6C  Wrist 2.0 1.9 0.1 47.4 37.0 21.9 Wrist Base 1st Digit     Ulnar Anti Sensory (5th Digit)  31C  Wrist 3.0 3.0 0.0 34.1 29.3 14.1 Wrist 5th Digit 47 47 0   Motor Left/Right Comparison   Stim Site L Lat (ms) R Lat (ms) L-R Lat (ms) L Amp (mV) R Amp (mV) L-R Amp (%) Site1 Site2 L Vel (m/s) R Vel (m/s) L-R Vel (m/s)  Median Motor (Abd Poll Brev)  30.9C  Wrist 3.1 3.0 0.1 8.6 9.1 5.5 Elbow Wrist 55 54 1  Elbow 6.9 7.0 0.1 8.5 9.0 5.6       Ulnar Motor (Abd Dig Min)  31C  Wrist 2.9 2.7 0.2 6.5 6.1 6.2 B Elbow Wrist 62 59 3  B  Elbow 5.9 5.9 0.0 6.3 6.4 1.6 A Elbow B Elbow 71 77 6  A Elbow 7.3 7.2 0.1 6.2 5.8 6.5          Waveforms:

## 2022-12-06 NOTE — Progress Notes (Signed)
Angel Kemp - 52 y.o. female MRN [id number]  Date of birth: 05/04/1970  Office Visit Note: Visit Date: 12/06/2022 PCP: Loyola Mast, MD Referred by: Kirtland Bouchard, PA-C  Subjective: Chief Complaint  Patient presents with   Left Arm - Pain, Numbness, Tingling   Right Arm - Pain, Numbness, Tingling   HPI: Angel Kemp is a 52 y.o. female who comes in todayHPI   ROS Otherwise per HPI.  Assessment & Plan: Visit Diagnoses:    ICD-10-CM   1. Paresthesia of skin  R20.2 NCV with EMG (electromyography)       Plan: Impression: Essentially NORMAL electrodiagnostic study of both upper limbs.  There is no significant electrodiagnostic evidence of nerve entrapment, brachial plexopathy, cervical radiculopathy or generalized peripheral neuropathy.    As you know, purely sensory or demyelinating radiculopathies and chemical radiculitis may not be detected with this particular electrodiagnostic study.  Recommendations: 1.  Follow-up with referring physician. 2.  Continue current management of symptoms.  Meds & Orders: No orders of the defined types were placed in this encounter.   Orders Placed This Encounter  Procedures   NCV with EMG (electromyography)    Follow-up: No follow-ups on file.   Procedures: No procedures performed  EMG & NCV Findings: All nerve conduction studies (as indicated in the following tables) were within normal limits.  All left vs. right side differences were within normal limits.    All examined muscles (as indicated in the following table) showed no evidence of electrical instability.    Impression: Essentially NORMAL electrodiagnostic study of both upper limbs.  There is no significant electrodiagnostic evidence of nerve entrapment, brachial plexopathy, cervical radiculopathy or generalized peripheral neuropathy.    As you know, purely sensory or demyelinating radiculopathies and chemical radiculitis may not be detected with this particular  electrodiagnostic study.  Recommendations: 1.  Follow-up with referring physician. 2.  Continue current management of symptoms.  ___________________________ Naaman Plummer FAAPMR Board Certified, American Board of Physical Medicine and Rehabilitation    Nerve Conduction Studies Anti Sensory Summary Table   Stim Site NR Peak (ms) Norm Peak (ms) P-T Amp (V) Norm P-T Amp Site1 Site2 Delta-P (ms) Dist (cm) Vel (m/s) Norm Vel (m/s)  Left Median Acr Palm Anti Sensory (2nd Digit)  30.3C  Wrist    3.1 <3.6 31.4 >10 Wrist Palm 1.5 0.0    Palm    1.6 <2.0 31.0         Right Median Acr Palm Anti Sensory (2nd Digit)  30.2C  Wrist    3.0 <3.6 41.4 >10 Wrist Palm 1.3 0.0    Palm    1.7 <2.0 28.9         Left Radial Anti Sensory (Base 1st Digit)  30.6C  Wrist    2.0 <3.1 47.4  Wrist Base 1st Digit 2.0 0.0    Right Radial Anti Sensory (Base 1st Digit)  30.3C  Wrist    1.9 <3.1 37.0  Wrist Base 1st Digit 1.9 0.0    Left Ulnar Anti Sensory (5th Digit)  31C  Wrist    3.0 <3.7 34.1 >15.0 Wrist 5th Digit 3.0 14.0 47 >38  Right Ulnar Anti Sensory (5th Digit)  30.7C  Wrist    3.0 <3.7 29.3 >15.0 Wrist 5th Digit 3.0 14.0 47 >38   Motor Summary Table   Stim Site NR Onset (ms) Norm Onset (ms) O-P Amp (mV) Norm O-P Amp Site1 Site2 Delta-0 (ms) Dist (cm) Vel (m/s) Norm Vel (  m/s)  Left Median Motor (Abd Poll Brev)  30.9C  Wrist    3.1 <4.2 8.6 >5 Elbow Wrist 3.8 21.0 55 >50  Elbow    6.9  8.5         Right Median Motor (Abd Poll Brev)  30.3C  Wrist    3.0 <4.2 9.1 >5 Elbow Wrist 4.0 21.5 54 >50  Elbow    7.0  9.0         Left Ulnar Motor (Abd Dig Min)  31C  Wrist    2.9 <4.2 6.5 >3 B Elbow Wrist 3.0 18.5 62 >53  B Elbow    5.9  6.3  A Elbow B Elbow 1.4 10.0 71 >53  A Elbow    7.3  6.2         Right Ulnar Motor (Abd Dig Min)  30.5C  Wrist    2.7 <4.2 6.1 >3 B Elbow Wrist 3.2 19.0 59 >53  B Elbow    5.9  6.4  A Elbow B Elbow 1.3 10.0 77 >53  A Elbow    7.2  5.8          EMG   Side Muscle  Nerve Root Ins Act Fibs Psw Amp Dur Poly Recrt Int Dennie Bible Comment  Left 1stDorInt Ulnar C8-T1 Nml Nml Nml Nml Nml 0 Nml Nml   Left Abd Poll Brev Median C8-T1 Nml Nml Nml Nml Nml 0 Nml Nml   Left ExtDigCom   Nml Nml Nml Nml Nml 0 Nml Nml   Left Triceps Radial C6-7-8 Nml Nml Nml Nml Nml 0 Nml Nml   Left Deltoid Axillary C5-6 Nml Nml Nml Nml Nml 0 Nml Nml     Nerve Conduction Studies Anti Sensory Left/Right Comparison   Stim Site L Lat (ms) R Lat (ms) L-R Lat (ms) L Amp (V) R Amp (V) L-R Amp (%) Site1 Site2 L Vel (m/s) R Vel (m/s) L-R Vel (m/s)  Median Acr Palm Anti Sensory (2nd Digit)  30.3C  Wrist 3.1 3.0 0.1 31.4 41.4 24.2 Wrist Palm     Palm 1.6 1.7 0.1 31.0 28.9 6.8       Radial Anti Sensory (Base 1st Digit)  30.6C  Wrist 2.0 1.9 0.1 47.4 37.0 21.9 Wrist Base 1st Digit     Ulnar Anti Sensory (5th Digit)  31C  Wrist 3.0 3.0 0.0 34.1 29.3 14.1 Wrist 5th Digit 47 47 0   Motor Left/Right Comparison   Stim Site L Lat (ms) R Lat (ms) L-R Lat (ms) L Amp (mV) R Amp (mV) L-R Amp (%) Site1 Site2 L Vel (m/s) R Vel (m/s) L-R Vel (m/s)  Median Motor (Abd Poll Brev)  30.9C  Wrist 3.1 3.0 0.1 8.6 9.1 5.5 Elbow Wrist 55 54 1  Elbow 6.9 7.0 0.1 8.5 9.0 5.6       Ulnar Motor (Abd Dig Min)  31C  Wrist 2.9 2.7 0.2 6.5 6.1 6.2 B Elbow Wrist 62 59 3  B Elbow 5.9 5.9 0.0 6.3 6.4 1.6 A Elbow B Elbow 71 77 6  A Elbow 7.3 7.2 0.1 6.2 5.8 6.5          Waveforms:                      Clinical History: No specialty comments available.   She reports that she quit smoking about 11 years ago. Her smoking use included cigarettes. She has never used smokeless tobacco. No results for input(s): "HGBA1C", "LABURIC"  in the last 8760 hours.  Objective:  VS:  HT:    WT:   BMI:     BP:   HR: bpm  TEMP: ( )  RESP:  Physical Exam  Ortho Exam  Imaging: No results found.  Past Medical/Family/Surgical/Social History: Medications & Allergies reviewed per EMR, new medications updated. Patient  Active Problem List   Diagnosis Date Noted   Bilateral carpal tunnel syndrome 11/18/2022   Internal derangement of left shoulder 11/18/2022   Hidradenitis axillaris 06/24/2020   Chronic midline low back pain without sciatica 04/12/2017   Multiple thyroid nodules 10/27/2016   Graves disease 10/27/2016   Urge incontinence 06/02/2016   Chest pain 07/23/2012   Hemoptysis 09/21/2011   Mediastinal mass 09/21/2011   Past Medical History:  Diagnosis Date   Allergy    Cough with hemoptysis 09/21/2011   Goiter    History of urinary urgency    Hypertension    Hyperthyroidism    Mass in chest 09/21/2011   Family History  Problem Relation Age of Onset   Hypertension Mother    Diabetes type II Mother    Past Surgical History:  Procedure Laterality Date   TUBAL LIGATION     VIDEO BRONCHOSCOPY  09/23/2011   Procedure: VIDEO BRONCHOSCOPY WITHOUT FLUORO;  Surgeon: Lonia Farber, MD;  Location: [facility];  Service: Cardiopulmonary;  Laterality: Bilateral;   WISDOM TOOTH EXTRACTION     Social History   Occupational History   Not on file  Tobacco Use   Smoking status: Former    Current packs/day: 0.00    Types: Cigarettes    Quit date: 07/07/2011    Years since quitting: 11.4   Smokeless tobacco: Never  Vaping Use   Vaping status: Never Used  Substance and Sexual Activity   Alcohol use: No   Drug use: No   Sexual activity: Yes

## 2022-12-06 NOTE — Progress Notes (Signed)
Functional Pain Scale - descriptive words and definitions  Distracting (5)    Aware of pain/able to complete some ADL's but limited by pain/sleep is affected and active distractions are only slightly useful. Moderate range order  Average Pain 5  BilUE NCS, pain starts in shoulder and goes into hands. She feels numbness, and tingling. She has difficultly grasping.

## 2022-12-07 ENCOUNTER — Ambulatory Visit
Admission: RE | Admit: 2022-12-07 | Discharge: 2022-12-07 | Disposition: A | Discharge status: Home / Self Care | Payer: Managed Care, Other (non HMO) | Source: Ambulatory Visit | Attending: Family Medicine | Admitting: Family Medicine

## 2022-12-07 DIAGNOSIS — Z1231 Encounter for screening mammogram for malignant neoplasm of breast: Secondary | ICD-10-CM

## 2022-12-09 ENCOUNTER — Encounter: Payer: Self-pay | Admitting: Family Medicine

## 2022-12-09 ENCOUNTER — Ambulatory Visit (INDEPENDENT_AMBULATORY_CARE_PROVIDER_SITE_OTHER): Payer: Managed Care, Other (non HMO) | Admitting: Family Medicine

## 2022-12-09 VITALS — BP 144/80 | HR 71 | Temp 97.8°F | Ht 64.0 in | Wt 183.0 lb

## 2022-12-09 DIAGNOSIS — M19012 Primary osteoarthritis, left shoulder: Secondary | ICD-10-CM | POA: Diagnosis not present

## 2022-12-09 DIAGNOSIS — R030 Elevated blood-pressure reading, without diagnosis of hypertension: Secondary | ICD-10-CM

## 2022-12-09 DIAGNOSIS — G5603 Carpal tunnel syndrome, bilateral upper limbs: Secondary | ICD-10-CM

## 2022-12-09 DIAGNOSIS — M19011 Primary osteoarthritis, right shoulder: Secondary | ICD-10-CM

## 2022-12-09 MED ORDER — NAPROXEN 500 MG PO TABS: 500.0000 mg | ORAL_TABLET | Freq: Two times a day (BID) | ORAL | 0 refills | Status: DC

## 2022-12-09 NOTE — Progress Notes (Signed)
[facility] [address] [place] [address] Dept: [phone] Dept Fax: [phone]  Office Visit  Subjective:    Patient ID: Angel Kemp, female    DOB: [birth year], 52 y.o..   MRN: [id number]  Chief Complaint  Patient presents with   Follow-up    3 week f/u.  Still having popping in shoulders but pain is better taking the Naproxen   History of Present Illness:  Patient is in today for reassessment of her bilateral shoulder pain and CTS. She was seen by Richardean Canal, PA-C (orthopedics). She had x-rays of her neck and shoulders. She also underwent NCV with EMG. The x-rays showed some mild arthritic changes. The NCV tests were normal. She feels her pain has improved with the course of naproxen. She does continue to get some mild popping of the shoulder joints. She notes she has been referred to PT and will go next week.  Past Medical History: Patient Active Problem List   Diagnosis Date Noted   Bilateral carpal tunnel syndrome 11/18/2022   Internal derangement of left shoulder 11/18/2022   Hidradenitis axillaris 06/24/2020   Chronic midline low back pain without sciatica 04/12/2017   Multiple thyroid nodules 10/27/2016   Graves disease 10/27/2016   Urge incontinence 06/02/2016   Chest pain 07/23/2012   Hemoptysis 09/21/2011   Mediastinal mass 09/21/2011   Past Surgical History:  Procedure Laterality Date   TUBAL LIGATION     VIDEO BRONCHOSCOPY  09/23/2011   Procedure: VIDEO BRONCHOSCOPY WITHOUT FLUORO;  Surgeon: Lonia Farber, MD;  Location: [facility];  Service: Cardiopulmonary;  Laterality: Bilateral;   WISDOM TOOTH EXTRACTION     Family History  Problem Relation Age of Onset   Hypertension Mother    Diabetes type II Mother    Outpatient Medications Prior to Visit  Medication Sig Dispense Refill   acetaminophen (TYLENOL) 325 MG tablet Take 650 mg by mouth every 6 (six) hours as needed.      methocarbamol (ROBAXIN) 500 MG tablet Take 1 tablet (500 mg total) by mouth at bedtime. 30 tablet 1   naproxen (NAPROSYN) 500 MG tablet Take 1 tablet (500 mg total) by mouth 2 (two) times daily with a meal. 28 tablet 0   methylPREDNISolone (MEDROL) 4 MG tablet Take as directed 21 tablet 0   No facility-administered medications prior to visit.   Allergies  Allergen Reactions   Latex Itching and Rash     Objective:   Today's Vitals   12/09/22 1343  BP: (!) 144/80  Pulse: 71  Temp: 97.8 F (36.6 C)  TempSrc: Temporal  SpO2: 97%  Weight: 183 lb (83 kg)  Height: 5\' 4"  (1.626 m)   Body mass index is 31.41 kg/m.   General: Well developed, well nourished. No acute distress. Extremities: Full ROM of shoulders. No joint swelling or tenderness. No crepitance. Psych: Alert and oriented. Normal mood and affect.  Health Maintenance Due  Topic Date Due   Hepatitis C Screening  Never done   Cervical Cancer Screening (HPV/Pap Cotest)  Never done   Colonoscopy  Never done   COVID-19 Vaccine (3 - Moderna risk series) 06/20/2019   Lung Cancer Screening  08/11/2019   MAMMOGRAM  09/20/2021     Assessment & Plan:   Problem List Items Addressed This Visit       Nervous and Auditory   Bilateral carpal tunnel syndrome    Clinically, has had some CTS. This is currently improved with naproxen use.  We will follow expectantly.        Musculoskeletal and Integument   Bilateral shoulder region arthritis - Primary    Reviewed results of evaluation to date with Angel Kemp. I will renew her naproxen. I support the referral to PT as the best approach to fully resolve her issue.      Relevant Medications   naproxen (NAPROSYN) 500 MG tablet   Other Visit Diagnoses     Elevated blood pressure reading in office without diagnosis of hypertension       Patient mother recently found to have Stage 4 lung cancer. She feels the stress has increased her BP. We will monitor this for now.        Return in about 2 months (around 02/08/2023) for Reassessment.   Loyola Mast, MD

## 2022-12-09 NOTE — Assessment & Plan Note (Signed)
Reviewed results of evaluation to date with Angel Kemp. I will renew her naproxen. I support the referral to PT as the best approach to fully resolve her issue.

## 2022-12-09 NOTE — Assessment & Plan Note (Signed)
Clinically, has had some CTS. This is currently improved with naproxen use. We will follow expectantly.

## 2022-12-15 ENCOUNTER — Other Ambulatory Visit: Payer: Self-pay

## 2022-12-15 ENCOUNTER — Encounter: Payer: Self-pay | Admitting: Physical Therapy

## 2022-12-15 ENCOUNTER — Ambulatory Visit: Payer: Managed Care, Other (non HMO) | Admitting: Physical Therapy

## 2022-12-15 DIAGNOSIS — M542 Cervicalgia: Secondary | ICD-10-CM

## 2022-12-15 DIAGNOSIS — M25512 Pain in left shoulder: Secondary | ICD-10-CM

## 2022-12-15 DIAGNOSIS — M25511 Pain in right shoulder: Secondary | ICD-10-CM

## 2022-12-15 DIAGNOSIS — G8929 Other chronic pain: Secondary | ICD-10-CM

## 2022-12-15 DIAGNOSIS — M6281 Muscle weakness (generalized): Secondary | ICD-10-CM | POA: Diagnosis not present

## 2022-12-15 NOTE — Therapy (Signed)
OUTPATIENT PHYSICAL THERAPY CERVICAL EVALUATION   Patient Name: Angel Kemp MRN: [id number] DOB:1970-12-31, 52 y.o., female Today's Date: 12/15/2022  END OF SESSION:  PT End of Session - 12/15/22 1822     Visit Number 1    Number of Visits 10    Date for PT Re-Evaluation 02/09/23    PT Start Time 1510    PT Stop Time 1550    PT Time Calculation (min) 40 min    Activity Tolerance Patient tolerated treatment well    Behavior During Therapy [facility] for tasks assessed/performed             Past Medical History:  Diagnosis Date   Allergy    Cough with hemoptysis 09/21/2011   Goiter    History of urinary urgency    Hypertension    Hyperthyroidism    Mass in chest 09/21/2011   Past Surgical History:  Procedure Laterality Date   TUBAL LIGATION     VIDEO BRONCHOSCOPY  09/23/2011   Procedure: VIDEO BRONCHOSCOPY WITHOUT FLUORO;  Surgeon: Lonia Farber, MD;  Location: [facility] ENDOSCOPY;  Service: Cardiopulmonary;  Laterality: Bilateral;   WISDOM TOOTH EXTRACTION     Patient Active Problem List   Diagnosis Date Noted   Bilateral carpal tunnel syndrome 11/18/2022   Bilateral shoulder region arthritis 11/18/2022   Hidradenitis axillaris 06/24/2020   Chronic midline low back pain without sciatica 04/12/2017   Multiple thyroid nodules 10/27/2016   Graves disease 10/27/2016   Urge incontinence 06/02/2016   Chest pain 07/23/2012   Hemoptysis 09/21/2011   Mediastinal mass 09/21/2011    PCP: Loyola Mast, MD   REFERRING PROVIDER: Kirtland Bouchard, PA-C   REFERRING DIAG: [phone] (ICD-10-CM) - Radiculopathy, cervical region  THERAPY DIAG:  Cervicalgia  Chronic left shoulder pain  Chronic right shoulder pain  Muscle weakness (generalized)  Rationale for Evaluation and Treatment: Rehabilitation  ONSET DATE: pain for over 3 years  SUBJECTIVE:                                                                                                                                                                                                          SUBJECTIVE STATEMENT: She comes in with neck pain, bilat shoulder pain, and bilat N/T in her hands. Her shoulders pop. Her left shoulder is worse than Right but both bad. It bothers her sleeping.  Hand dominance: Right  PERTINENT HISTORY:  HTN, chronic pain  PAIN:  NPRS scale: 7/10 upon arrival Pain location:bilat shoulders sometimes neck Pain description: intermittent sharp pain, heaviness Aggravating factors: job,  reaching up and behind back Relieving factors: rest, meds   PRECAUTIONS: None  RED FLAGS: None     WEIGHT BEARING RESTRICTIONS: No  FALLS:  Has patient fallen in last 6 months? No  OCCUPATION: She works at [employer] does work on [employer] and does a lot of repetitive work.  PLOF: Independent  PATIENT GOALS:   NEXT MD VISIT:   OBJECTIVE:  Note: Objective measures were completed at Evaluation unless otherwise noted.  DIAGNOSTIC FINDINGS:  Impression: Essentially NORMAL electrodiagnostic study of both upper limbs.  There is no significant electrodiagnostic evidence of nerve entrapment, brachial plexopathy, cervical radiculopathy or generalized peripheral neuropathy.    XR Shoulder Left   Result Date: 11/28/2022 Left shoulder 3 views: Shoulder is well located.  No acute fractures or acute findings.  Mild glenohumeral joint changes.  AC joint mild to moderate changes.   XR Shoulder Right   Result Date: 11/28/2022 Right shoulder 3 views: Shoulder is well located.  No acute fractures.  Mild to moderate AC joint changes.  Mild glenohumeral joint changes.  No acute findings otherwise.   XR Cervical Spine 2 or 3 views   Result Date: 11/28/2022 Cervical spine 2 views: No acute fractures.  No spondylolisthesis.  C3-C6 mild anterior vertebral body spurring.  Loss of lordotic curvature.  Otherwise no acute findings  PATIENT SURVEYS:  Eval: FOTO 51% functional, goal is  63%  COGNITION: Overall cognitive status: Within functional limits for tasks assessed  SENSATION: WFL  POSTURE: rounded shoulders, forward head, thoracic kyphosis   CERVICAL ROM: WNL     UPPER EXTREMITY ROM:  Active ROM Right eval Left eval  Shoulder flexion 145 140  Shoulder extension    Shoulder abduction WNL WNL  Shoulder adduction    Shoulder extension    Shoulder internal rotation WNL WNL  Shoulder external rotation 80 80  Elbow flexion    Elbow extension    Wrist flexion    Wrist extension    Wrist ulnar deviation    Wrist radial deviation    Wrist pronation    Wrist supination     (Blank rows = not tested)  UPPER EXTREMITY MMT:  MMT Right eval Left eval  Shoulder flexion 4 4  Shoulder extension    Shoulder abduction 4 4  Shoulder adduction    Shoulder extension    Shoulder internal rotation 4+ 4+  Shoulder external rotation 4 4  Middle trapezius    Lower trapezius    Elbow flexion 5 5  Elbow extension 5 5  Wrist flexion    Wrist extension    Wrist ulnar deviation    Wrist radial deviation    Wrist pronation    Wrist supination    Grip strength     (Blank rows = not tested)  CERVICAL SPECIAL TESTS:  Spurling's test: Negative   TODAY'S TREATMENT:  Eval HEP creation and review with demonstration and trial set preformed, see below for details Pre mod Estim X 10 min to bilat shoulders and upper traps   PATIENT EDUCATION: Education details: HEP, PT plan of care Person educated: Patient Education method: Explanation, Demonstration, Verbal cues, and Handouts Education comprehension: verbalized understanding and needs further education   HOME EXERCISE PROGRAM: Access Code: [email or web address] URL: [email or web address] Date: 12/15/2022 Prepared by: Ivery Quale  Exercises - Standing Cervical Retraction  - 2 x daily - 6 x weekly - 1-2 sets - 10 reps - Standing Shoulder Posterior Capsule Stretch  - 2 x daily - 6 x weekly -  1 sets -  5 reps - 10 sec hold - Doorway Pec Stretch at 90 Degrees Abduction  - 2 x daily - 6 x weekly - 1 sets - 3 reps - 30 sec hold - Standing Shoulder Flexion Wall Slide  - 2 x daily - 6 x weekly - 1 sets - 10 reps - 5 sec hold - Shoulder Extension with Resistance - Neutral  - 2 x daily - 6 x weekly - 2 sets - 15 reps - Standing Shoulder Row with Anchored Resistance  - 2 x daily - 6 x weekly - 2 sets - 15 reps - Shoulder External Rotation and Scapular Retraction with Resistance  - 2 x daily - 6 x weekly - 2 sets - 15 reps - Standing Shoulder Horizontal Abduction with Resistance  - 2 x daily - 6 x weekly - 2 sets - 15 reps  Patient Education - TENS UNIT - AUVON Dual Channel TENS Unit  ASSESSMENT:  CLINICAL IMPRESSION: Patient referred to PT for cervical radiculopathy. EMG ended up normal but she does have signs of bilateral shoulder impingement with pain and popping in her shoulder . Patient will benefit from skilled PT to address below impairments, limitations and improve overall function.  OBJECTIVE IMPAIRMENTS: decreased activity tolerance, decreased shoulder mobility, decreased ROM, decreased strength, impaired flexibility, impaired UE use, postural dysfunction, and pain.  ACTIVITY LIMITATIONS: reaching, lifting, carry,  cleaning, driving, and or occupation  PERSONAL FACTORS:  see PMH also affecting patient's functional outcome.  REHAB POTENTIAL: Good  CLINICAL DECISION MAKING: Stable/uncomplicated  EVALUATION COMPLEXITY: Low    GOALS: Short term PT Goals Target date: 01/12/2023   Pt will be I and compliant with HEP. Baseline:  Goal status: New Pt will decrease pain by 25% overall Baseline:7 Goal status: New  Long term PT goals Target date:02/09/2023   Pt will improve bilat shoulder AROM to [facility] to improve functional reaching Baseline: Goal status: New Pt will improve  bilat shoulder strength to at least 4+/5 MMT to improve functional strength Baseline: Goal status: New Pt  will improve FOTO to at least 63% functional to show improved function Baseline: Goal status: New Pt will reduce pain to overall less than 3/10 with usual activity and work activity. Baseline: Goal status: New  PLAN: PT FREQUENCY: 1-2 times per week   PT DURATION: 8 weeks  PLANNED INTERVENTIONS (unless contraindicated): aquatic PT, Canalith repositioning, cryotherapy, Electrical stimulation, Iontophoresis with 4 mg/ml dexamethasome, Moist heat, traction, Ultrasound, gait training, Therapeutic exercise, balance training, neuromuscular re-education, patient/family education, manual techniques, passive ROM, dry needling, taping, vasopnuematic device, vestibular, spinal manipulations, joint manipulations 97110-Therapeutic exercises, 97530- Therapeutic activity, [id number]- Neuromuscular re-education, 97535- Self Care, and 25427- Manual therapy  PLAN FOR NEXT SESSION: review HEP, TENS if desired, consider DN   April Manson, PT,DPT 12/15/2022, 6:23 PM

## 2022-12-28 ENCOUNTER — Ambulatory Visit: Payer: Managed Care, Other (non HMO) | Admitting: Orthopaedic Surgery

## 2022-12-29 ENCOUNTER — Encounter: Payer: Managed Care, Other (non HMO) | Admitting: Physical Therapy

## 2023-01-12 ENCOUNTER — Encounter: Payer: Managed Care, Other (non HMO) | Admitting: Physical Therapy

## 2023-01-12 ENCOUNTER — Telehealth: Payer: Self-pay | Admitting: Physical Therapy

## 2023-01-12 NOTE — Telephone Encounter (Signed)
Pt did not show for PT appointment today. They were contacted at the number listed in chart but it gives automatic message that the number cannot be completed so does not connect to a call.  Ivery Quale, PT, DPT 01/12/23 2:10 PM

## 2023-01-19 ENCOUNTER — Encounter: Payer: Managed Care, Other (non HMO) | Admitting: Internal Medicine

## 2023-01-23 ENCOUNTER — Encounter: Payer: Self-pay | Admitting: Orthopaedic Surgery

## 2023-01-23 ENCOUNTER — Ambulatory Visit (INDEPENDENT_AMBULATORY_CARE_PROVIDER_SITE_OTHER): Payer: Managed Care, Other (non HMO) | Admitting: Orthopaedic Surgery

## 2023-01-23 DIAGNOSIS — G8929 Other chronic pain: Secondary | ICD-10-CM | POA: Diagnosis not present

## 2023-01-23 DIAGNOSIS — M25512 Pain in left shoulder: Secondary | ICD-10-CM | POA: Diagnosis not present

## 2023-01-23 MED ORDER — METHYLPREDNISOLONE ACETATE 40 MG/ML IJ SUSP
40.0000 mg | INTRAMUSCULAR | Status: AC | PRN
  Administered 2023-01-23: 40 mg via INTRA_ARTICULAR

## 2023-01-23 MED ORDER — LIDOCAINE HCL 1 % IJ SOLN
3.0000 mL | INTRAMUSCULAR | Status: AC | PRN
  Administered 2023-01-23: 3 mL

## 2023-01-23 NOTE — Progress Notes (Signed)
The patient comes in today to go over nerve conduction studies as it relates to her bilateral upper extremity numbness and tingling.  Those studies were actually normal and shows no nerve compression throughout both her upper extremities or from her neck.  She has been to a few physical therapy visits but had to cancel some of them due to her mom's illness.  She says her biggest complaint right now is left shoulder pain and some popping in that left shoulder.  I have reviewed her previous x-rays of her cervical spine and both shoulders.  Both shoulders move fluidly and smoothly.  She does show some more signs of impingement with her left shoulder than the right side and it seems to be more symptomatic for her.  The rotator cuff itself seems to be strong and there is no limitation to her motion.  I did go over the nerve conduction studies with her.  I am not sure as to the reasoning behind her numbness and tingling and this may be more physiologic.  However she is not a diabetic.  She may end up needing a referral to neurology if this continues.  I did recommend a steroid injection today in her left shoulder subacromial outlet and she agreed to this and tolerated it very well.  I would like to see her back in 6 weeks to see how she is doing overall.  She agrees with this treatment plan.    Procedure Note  Patient: Angel Kemp             Date of Birth: July 19, 1970           MRN: [id number]             Visit Date: 01/23/2023  Procedures: Visit Diagnoses:  1. Chronic left shoulder pain     Large Joint Inj: L subacromial bursa on 01/23/2023 4:10 PM Indications: pain and diagnostic evaluation Details: 22 G 1.5 in needle  Arthrogram: No  Medications: 3 mL lidocaine 1 %; 40 mg methylPREDNISolone acetate 40 MG/ML Outcome: tolerated well, no immediate complications Procedure, treatment alternatives, risks and benefits explained, specific risks discussed. Consent was given by the patient.  Immediately prior to procedure a time out was called to verify the correct patient, procedure, equipment, support staff and site/side marked as required. Patient was prepped and draped in the usual sterile fashion.

## 2023-02-06 ENCOUNTER — Ambulatory Visit (AMBULATORY_SURGERY_CENTER): Payer: Managed Care, Other (non HMO)

## 2023-02-06 VITALS — Ht 64.0 in | Wt 183.0 lb

## 2023-02-06 DIAGNOSIS — Z1211 Encounter for screening for malignant neoplasm of colon: Secondary | ICD-10-CM

## 2023-02-06 MED ORDER — NA SULFATE-K SULFATE-MG SULF 17.5-3.13-1.6 GM/177ML PO SOLN: 1.0000 | Freq: Once | ORAL | 0 refills | Status: AC

## 2023-02-06 NOTE — Progress Notes (Signed)

## 2023-02-15 ENCOUNTER — Ambulatory Visit: Payer: Managed Care, Other (non HMO) | Admitting: Family Medicine

## 2023-02-15 ENCOUNTER — Other Ambulatory Visit (HOSPITAL_COMMUNITY)
Admission: RE | Admit: 2023-02-15 | Discharge: 2023-02-15 | Disposition: A | Discharge status: Home / Self Care | Payer: Managed Care, Other (non HMO) | Source: Ambulatory Visit | Attending: Family Medicine | Admitting: Family Medicine

## 2023-02-15 VITALS — BP 124/78 | HR 75 | Temp 98.2°F | Ht 64.0 in | Wt 178.8 lb

## 2023-02-15 DIAGNOSIS — N888 Other specified noninflammatory disorders of cervix uteri: Secondary | ICD-10-CM

## 2023-02-15 DIAGNOSIS — M19011 Primary osteoarthritis, right shoulder: Secondary | ICD-10-CM

## 2023-02-15 DIAGNOSIS — M19012 Primary osteoarthritis, left shoulder: Secondary | ICD-10-CM

## 2023-02-15 DIAGNOSIS — Z124 Encounter for screening for malignant neoplasm of cervix: Secondary | ICD-10-CM

## 2023-02-15 NOTE — Assessment & Plan Note (Signed)
 Pap smear obtained. I will refer Angel Kemp to GYN for their evaluation and potential colposcopy with biopsy.

## 2023-02-15 NOTE — Progress Notes (Signed)
 [facility] [address] [place] [address] Dept: [phone] Dept Fax: [phone]  Office Visit  Subjective:    Patient ID: Angel Kemp, female    DOB: 11/25/1970, 53 y.o..   MRN: [id number]  Chief Complaint  Patient presents with   Follow-up    2 month f/u, no concerns.    History of Present Illness:  Patient is in today for reassessment of her shoulder pain. I had seen her in early Nov. with bilateral shoulder pain. She had recent x-rays that had should some mild arthritic changes. She failed conservative measures with naproxen  and PT. She was seen by Dr. Vernetta who gave her a steroid injection. She now notes she feels much improved.   At her last visit, Ms. Roughton's BP was elevated. We determined to monitor this.  Past Medical History: Patient Active Problem List   Diagnosis Date Noted   Bilateral shoulder region arthritis 11/18/2022   Hidradenitis axillaris 06/24/2020   Chronic midline low back pain without sciatica 04/12/2017   Multiple thyroid  nodules 10/27/2016   Graves disease 10/27/2016   Urge incontinence 06/02/2016   Chest pain 07/23/2012   Hemoptysis 09/21/2011   Mediastinal mass 09/21/2011   Past Surgical History:  Procedure Laterality Date   TUBAL LIGATION     VIDEO BRONCHOSCOPY  09/23/2011   Procedure: VIDEO BRONCHOSCOPY WITHOUT FLUORO;  Surgeon: Florian Hint, MD;  Location: [facility] ENDOSCOPY;  Service: Cardiopulmonary;  Laterality: Bilateral;   WISDOM TOOTH EXTRACTION     Family History  Problem Relation Age of Onset   Cancer Mother        Lung   Hypertension Mother    Diabetes type II Mother    Colon cancer Neg Hx    Rectal cancer Neg Hx    Stomach cancer Neg Hx    Outpatient Medications Prior to Visit  Medication Sig Dispense Refill   acetaminophen  (TYLENOL ) 325 MG tablet Take 650 mg by mouth every 6 (six) hours as needed.     methocarbamol  (ROBAXIN ) 500 MG tablet Take 1  tablet (500 mg total) by mouth at bedtime. (Patient not taking: Reported on 02/06/2023) 30 tablet 1   naproxen  (NAPROSYN ) 500 MG tablet Take 1 tablet (500 mg total) by mouth 2 (two) times daily with a meal. (Patient not taking: Reported on 02/06/2023) 28 tablet 0   No facility-administered medications prior to visit.   Allergies  Allergen Reactions   Latex Itching and Rash     Objective:   Today's Vitals   02/15/23 1352  BP: 124/78  Pulse: 75  Temp: 98.2 F (36.8 C)  TempSrc: Temporal  SpO2: 99%  Weight: 178 lb 12.8 oz (81.1 kg)  Height: 5' 4 (1.626 m)   Body mass index is 30.69 kg/m.   General: Well developed, well nourished. No acute distress. Extremities: Full ROM of left shoulder. No joint swelling or tenderness.  GU: Chaperone present for exam. Normal external genitalia. Vaginal vault is pink without discharge. There is   a mass in the cervix, taking up the lower third of the visible cervix. This is dark red in color. This does not   appear to be an ectropion. It is firm on palpation. No CMT. Uterus normal size. No adnexal masses. No   anterior or posterior wall prolapse. Psych: Alert and oriented. Normal mood and affect.  Health Maintenance Due  Topic Date Due   Hepatitis C Screening  Never done   Cervical Cancer Screening (HPV/Pap Cotest)  Never done   Colonoscopy  Never done   Lung Cancer Screening  08/11/2019     Assessment & Plan:   Problem List Items Addressed This Visit       Musculoskeletal and Integument   Bilateral shoulder region arthritis - Primary   Resolved after intra-articular steroid injection.        Other   Mass of cervix   Pap smear obtained. I will refer Ms. Arteaga to GYN for their evaluation and potential colposcopy with biopsy.      Relevant Orders   Ambulatory referral to Obstetrics / Gynecology   Other Visit Diagnoses       Screening for cervical cancer       Relevant Orders   Cytology - PAP       Return for after GYN  assessment.SABRA Garnette CHRISTELLA Thedora, MD

## 2023-02-15 NOTE — Assessment & Plan Note (Signed)
 Resolved after intra-articular steroid injection.

## 2023-02-16 LAB — CYTOLOGY - PAP
Comment: NEGATIVE
Diagnosis: NEGATIVE
High risk HPV: NEGATIVE

## 2023-02-23 ENCOUNTER — Encounter: Payer: Self-pay | Admitting: Internal Medicine

## 2023-02-23 ENCOUNTER — Encounter: Payer: Managed Care, Other (non HMO) | Admitting: Internal Medicine

## 2023-02-23 ENCOUNTER — Ambulatory Visit: Payer: Managed Care, Other (non HMO) | Admitting: Internal Medicine

## 2023-02-23 VITALS — BP 120/58 | HR 56 | Temp 97.3°F | Resp 20 | Ht 64.0 in | Wt 183.0 lb

## 2023-02-23 DIAGNOSIS — Z1211 Encounter for screening for malignant neoplasm of colon: Secondary | ICD-10-CM

## 2023-02-23 MED ORDER — SODIUM CHLORIDE 0.9 % IV SOLN: 500.0000 mL | Freq: Once | INTRAVENOUS | Status: DC

## 2023-02-23 NOTE — Patient Instructions (Signed)
Resume previous diet.  Follow up colonoscopy in 10 years.    YOU HAD AN ENDOSCOPIC PROCEDURE TODAY AT THE [facility]:   Refer to the procedure report that was given to you for any specific questions about what was found during the examination.  If the procedure report does not answer your questions, please call your gastroenterologist to clarify.  If you requested that your care partner not be given the details of your procedure findings, then the procedure report has been included in a sealed envelope for you to review at your convenience later.  YOU SHOULD EXPECT: Some feelings of bloating in the abdomen. Passage of more gas than usual.  Walking can help get rid of the air that was put into your GI tract during the procedure and reduce the bloating. If you had a lower endoscopy (such as a colonoscopy or flexible sigmoidoscopy) you may notice spotting of blood in your stool or on the toilet paper. If you underwent a bowel prep for your procedure, you may not have a normal bowel movement for a few days.  Please Note:  You might notice some irritation and congestion in your nose or some drainage.  This is from the oxygen used during your procedure.  There is no need for concern and it should clear up in a day or so.  SYMPTOMS TO REPORT IMMEDIATELY:  Following lower endoscopy (colonoscopy or flexible sigmoidoscopy):  Excessive amounts of blood in the stool  Significant tenderness or worsening of abdominal pains  Swelling of the abdomen that is new, acute  Fever of 100F or higher  For urgent or emergent issues, a gastroenterologist can be reached at any hour by calling [phone]. Do not use MyChart messaging for urgent concerns.    DIET:  We do recommend a small meal at first, but then you may proceed to your regular diet.  Drink plenty of fluids but you should avoid alcoholic beverages for 24 hours.  ACTIVITY:  You should plan to take it easy for the rest of today and you  should NOT DRIVE or use heavy machinery until tomorrow (because of the sedation medicines used during the test).    FOLLOW UP: Our staff will call the number listed on your records the next business day following your procedure.  We will call around 7:15- 8:00 am to check on you and address any questions or concerns that you may have regarding the information given to you following your procedure. If we do not reach you, we will leave a message.     If any biopsies were taken you will be contacted by phone or by letter within the next 1-3 weeks.  Please call us at [phone] if you have not heard about the biopsies in 3 weeks.    SIGNATURES/CONFIDENTIALITY: You and/or your care partner have signed paperwork which will be entered into your electronic medical record.  These signatures attest to the fact that that the information above on your After Visit Summary has been reviewed and is understood.  Full responsibility of the confidentiality of this discharge information lies with you and/or your care-partner.

## 2023-02-23 NOTE — Progress Notes (Signed)
HISTORY OF PRESENT ILLNESS:  Angel Kemp is a 53 y.o. female Angel Kemp today for routine screening colonoscopy.  REVIEW OF SYSTEMS:  All non-GI ROS negative except for  Past Medical History:  Diagnosis Date   Allergy    Arthritis    Cough with hemoptysis 09/21/2011   Goiter    History of urinary urgency    Hypertension    Hyperthyroidism    Mass in chest 09/21/2011    Past Surgical History:  Procedure Laterality Date   TUBAL LIGATION     VIDEO BRONCHOSCOPY  09/23/2011   Procedure: VIDEO BRONCHOSCOPY WITHOUT FLUORO;  Surgeon: Lonia Farber, MD;  Location: [facility] ENDOSCOPY;  Service: Cardiopulmonary;  Laterality: Bilateral;   WISDOM TOOTH EXTRACTION      Social History Angel Kemp  reports that she quit smoking about 11 years ago. Her smoking use included cigarettes. She has never used smokeless tobacco. She reports that she does not drink alcohol and does not use drugs.  family history includes Cancer in her mother; Diabetes type II in her mother; Hypertension in her mother.  Allergies  Allergen Reactions   Latex Itching and Rash       PHYSICAL EXAMINATION: Vital signs: BP 129/84   Pulse 67   Temp (!) 97.3 F (36.3 C) (Skin)   Ht 5\' 4"  (1.626 m)   Wt 183 lb (83 kg)   SpO2 97%   BMI 31.41 kg/m  General: Well-developed, well-nourished, no acute distress HEENT: Sclerae are anicteric, conjunctiva pink. Oral mucosa intact Lungs: Clear Heart: Regular Abdomen: soft, nontender, nondistended, no obvious ascites, no peritoneal signs, normal bowel sounds. No organomegaly. Extremities: No edema Psychiatric: alert and oriented x3. Cooperative     ASSESSMENT:  Colon cancer screening   PLAN:   Screening colonoscopy

## 2023-02-23 NOTE — Op Note (Signed)
[facility] Patient Name: Angel Kemp Procedure Date: 02/23/2023 11:17 AM MRN: [id number] Endoscopist: Wilhemina Bonito. Marina Goodell , MD, [id number] Age: 53 Referring MD:  Date of Birth: 1970-05-25 Gender: Female Account #: [id number] Procedure:                Colonoscopy Indications:              Screening for colorectal malignant neoplasm Medicines:                Monitored Anesthesia Care Procedure:                Pre-Anesthesia Assessment:                           - Prior to the procedure, a History and Physical                            was performed, and patient medications and                            allergies were reviewed. The patient's tolerance of                            previous anesthesia was also reviewed. The risks                            and benefits of the procedure and the sedation                            options and risks were discussed with the patient.                            All questions were answered, and informed consent                            was obtained. Prior Anticoagulants: The patient has                            taken no anticoagulant or antiplatelet agents.                            After reviewing the risks and benefits, the patient                            was deemed in satisfactory condition to undergo the                            procedure.                           After obtaining informed consent, the colonoscope                            was passed under direct vision. Throughout the  procedure, the patient's blood pressure, pulse, and                            oxygen saturations were monitored continuously. The                            Olympus Scope SN [id number] was introduced through the                            anus and advanced to the the cecum, identified by                            appendiceal orifice and ileocecal valve. The                            ileocecal valve, appendiceal  orifice, and rectum                            were photographed. The quality of the bowel                            preparation was excellent. The colonoscopy was                            performed without difficulty. The patient tolerated                            the procedure well. The bowel preparation used was                            SUPREP via split dose instruction. Scope In: 11:47:31 AM Scope Out: 11:58:05 AM Scope Withdrawal Time: 0 hours 7 minutes 49 seconds  Total Procedure Duration: 0 hours 10 minutes 34 seconds  Findings:                 The entire examined colon appeared normal on direct                            and retroflexion views. Complications:            No immediate complications. Estimated blood loss:                            None. Estimated Blood Loss:     Estimated blood loss: none. Impression:               - The entire examined colon is normal on direct and                            retroflexion views.                           - No specimens collected. Recommendation:           - Repeat colonoscopy in 10 years for screening  purposes.                           - Patient has a contact number available for                            emergencies. The signs and symptoms of potential                            delayed complications were discussed with the                            patient. Return to normal activities tomorrow.                            Written discharge instructions were provided to the                            patient.                           - Resume previous diet.                           - Continue present medications. Wilhemina Bonito. Marina Goodell, MD 02/23/2023 12:02:53 PM This report has been signed electronically.

## 2023-02-23 NOTE — Progress Notes (Signed)
Pt's states no medical or surgical changes since previsit or office visit. 

## 2023-02-23 NOTE — Progress Notes (Signed)
Sedate, gd SR, tolerated procedure well, VSS, report to RN 

## 2023-02-24 ENCOUNTER — Telehealth: Payer: Self-pay

## 2023-02-24 NOTE — Telephone Encounter (Signed)
  Follow up Call-     02/23/2023   10:46 AM  Call back number  Post procedure Call Back phone  # [phone]  Permission to leave phone message Yes     Patient questions:  Do you have a fever, pain , or abdominal swelling? No. Pain Score  0 *  Have you tolerated food without any problems? Yes.    Have you been able to return to your normal activities? Yes.    Do you have any questions about your discharge instructions: Diet   No. Medications  No. Follow up visit  No.  Do you have questions or concerns about your Care? No.  Actions: * If pain score is 4 or above: No action needed, pain <4.

## 2023-03-06 ENCOUNTER — Ambulatory Visit: Payer: Managed Care, Other (non HMO) | Admitting: Orthopaedic Surgery

## 2023-03-16 ENCOUNTER — Encounter: Payer: Managed Care, Other (non HMO) | Admitting: Obstetrics and Gynecology

## 2023-03-27 ENCOUNTER — Ambulatory Visit: Payer: Managed Care, Other (non HMO) | Admitting: Orthopaedic Surgery

## 2023-03-27 ENCOUNTER — Encounter: Payer: Self-pay | Admitting: Orthopaedic Surgery

## 2023-03-27 DIAGNOSIS — M25511 Pain in right shoulder: Secondary | ICD-10-CM | POA: Diagnosis not present

## 2023-03-27 DIAGNOSIS — M25512 Pain in left shoulder: Secondary | ICD-10-CM | POA: Diagnosis not present

## 2023-03-27 DIAGNOSIS — G8929 Other chronic pain: Secondary | ICD-10-CM

## 2023-03-27 MED ORDER — METHYLPREDNISOLONE ACETATE 40 MG/ML IJ SUSP
40.0000 mg | INTRAMUSCULAR | Status: AC | PRN
  Administered 2023-03-27: 40 mg via INTRA_ARTICULAR

## 2023-03-27 MED ORDER — LIDOCAINE HCL 1 % IJ SOLN
3.0000 mL | INTRAMUSCULAR | Status: AC | PRN
  Administered 2023-03-27: 3 mL

## 2023-03-27 NOTE — Progress Notes (Signed)
The patient is a 53 year old female that we last saw in December and did place a steroid injection in her left shoulder subacromial outlet.  She said the injection was great when we first did it but has been wearing off some.  She has good days and bad days.  She has been dealing with right shoulder pain but we held off on an injection of that shoulder.  She would like to have an injection today in her right shoulder.  Examination of both shoulder still shows signs of impingement of both shoulders with the left worse than the right.  I did not recommend a steroid injection in her right shoulder today and she agreed to this and tolerated it well.  At this point we do need to set her up for a MRI of her left shoulder to assess for pathology that is causing her to still have significant pain and weakness in that left shoulder.  She agrees to this treatment plan.  Will see her back in 4 weeks and hopefully go over MRI with her left shoulder at that visit.    Procedure Note  Patient: Angel Kemp             Date of Birth: December 15, 1970           MRN: [id number]             Visit Date: 03/27/2023  Procedures: Visit Diagnoses:  1. Chronic left shoulder pain   2. Acute pain of right shoulder     Large Joint Inj: R subacromial bursa on 03/27/2023 3:12 PM Indications: pain and diagnostic evaluation Details: 22 G 1.5 in needle  Arthrogram: No  Medications: 3 mL lidocaine 1 %; 40 mg methylPREDNISolone acetate 40 MG/ML Outcome: tolerated well, no immediate complications Procedure, treatment alternatives, risks and benefits explained, specific risks discussed. Consent was given by the patient. Immediately prior to procedure a time out was called to verify the correct patient, procedure, equipment, support staff and site/side marked as required. Patient was prepped and draped in the usual sterile fashion.

## 2023-03-28 ENCOUNTER — Other Ambulatory Visit: Payer: Self-pay

## 2023-03-28 DIAGNOSIS — G8929 Other chronic pain: Secondary | ICD-10-CM

## 2023-04-12 ENCOUNTER — Encounter: Payer: Self-pay | Admitting: Family Medicine

## 2023-04-12 ENCOUNTER — Ambulatory Visit: Payer: Managed Care, Other (non HMO) | Admitting: Family Medicine

## 2023-04-12 VITALS — BP 120/82 | HR 81 | Temp 98.6°F | Ht 64.0 in | Wt 182.6 lb

## 2023-04-12 DIAGNOSIS — Z634 Disappearance and death of family member: Secondary | ICD-10-CM

## 2023-04-12 MED ORDER — ZOLPIDEM TARTRATE 5 MG PO TABS: 5.0000 mg | ORAL_TABLET | Freq: Every evening | ORAL | 1 refills | Status: AC | PRN

## 2023-04-12 NOTE — Assessment & Plan Note (Addendum)
 Angel Kemp is showing appropriate grief reactions. I will refer her for counseling. I will provide a short-course of Ambien to help with sleep. I anticipate that she could return to work by 05/09/2023. FMLA forms completed and given to staff to fax.

## 2023-04-12 NOTE — Progress Notes (Signed)
 [facility] [address] [place] [address] Dept: [phone] Dept Fax: [phone]  Office Visit  Subjective:    Patient ID: Angel Kemp, female    DOB: 20-Jan-1971, 53 y.o..   MRN: [id number]  Chief Complaint  Patient presents with   Follow-up    F/u to discuss FMLA paperwork for patient's mothers passing.    History of Present Illness:  Patient is in today for discussion of FMLA secondary to bereavement. Angel Kemp found her mother deceased on 22-Apr-2023. It had been an expected death. Angel Kemp mother had been living with her. She tried to return to work the following week, but notes that a comment form a co-worker set off her grief on 2/20. She has not been ready to go back since that time. She is managing issue of the estate, but also struggling with her own grief over her loss. She notes she has difficulty with sleep. She also asks about counseling.  Past Medical History: Patient Active Problem List   Diagnosis Date Noted   Mass of cervix 02/15/2023   Bilateral shoulder region arthritis 11/18/2022   Hidradenitis axillaris 06/24/2020   Chronic midline low back pain without sciatica 04/12/2017   Multiple thyroid nodules 10/27/2016   Graves disease 10/27/2016   Urge incontinence 06/02/2016   Chest pain 07/23/2012   Hemoptysis 09/21/2011   Mediastinal mass 09/21/2011   Past Surgical History:  Procedure Laterality Date   TUBAL LIGATION     VIDEO BRONCHOSCOPY  09/23/2011   Procedure: VIDEO BRONCHOSCOPY WITHOUT FLUORO;  Surgeon: Lonia Farber, MD;  Location: [facility];  Service: Cardiopulmonary;  Laterality: Bilateral;   WISDOM TOOTH EXTRACTION     Family History  Problem Relation Age of Onset   Cancer Mother        Lung   Hypertension Mother    Diabetes type II Mother    Colon cancer Neg Hx    Rectal cancer Neg Hx    Stomach cancer Neg Hx    Esophageal cancer Neg Hx    Outpatient  Medications Prior to Visit  Medication Sig Dispense Refill   acetaminophen (TYLENOL) 325 MG tablet Take 650 mg by mouth every 6 (six) hours as needed.     fluticasone (FLONASE) 50 MCG/ACT nasal spray Place into both nostrils daily.     No facility-administered medications prior to visit.   Allergies  Allergen Reactions   Latex Itching and Rash     Objective:   Today's Vitals   04/12/23 1416  BP: 120/82  Pulse: 81  Temp: 98.6 F (37 C)  TempSrc: Temporal  SpO2: 98%  Weight: 182 lb 9.6 oz (82.8 kg)  Height: 5\' 4"  (1.626 m)   Body mass index is 31.34 kg/m.   General: Well developed, well nourished. No acute distress. Psych: Alert and oriented. Depressed mood and sad affect with frequent tearfulness.  Health Maintenance Due  Topic Date Due   Hepatitis C Screening  Never done   Lung Cancer Screening  08/11/2019     Assessment & Plan:   Problem List Items Addressed This Visit       Other   Bereavement - Primary   Angel Kemp is showing appropriate grief reactions. I will refer her for counseling. I will provide a short-course of Ambien to help with sleep. I anticipate that she could return to work by 05/09/2023. FMLA forms completed and given to staff to fax.      Relevant Orders  Ambulatory referral to Psychology    Return in about 4 weeks (around 05/10/2023), or if symptoms worsen or fail to improve.   Loyola Mast, MD

## 2023-04-19 ENCOUNTER — Other Ambulatory Visit: Payer: Self-pay | Admitting: Family Medicine

## 2023-04-20 ENCOUNTER — Other Ambulatory Visit: Payer: Self-pay

## 2023-04-20 MED ORDER — FLUTICASONE PROPIONATE 50 MCG/ACT NA SUSP
2.0000 | Freq: Every day | NASAL | 3 refills | Status: DC
  Filled 2023-04-20: qty 16, 30d supply, fill #0

## 2023-04-20 NOTE — Telephone Encounter (Signed)
 Requesting: fluticasone (FLONASE) 50 MCG/ACT nasal spray  Last Visit: 04/12/2023 Next Visit: Visit date not found Last Refill: by Historical Provider  Please Advise

## 2023-04-22 ENCOUNTER — Ambulatory Visit
Admission: RE | Admit: 2023-04-22 | Discharge: 2023-04-22 | Disposition: A | Discharge status: Home / Self Care | Payer: Managed Care, Other (non HMO) | Source: Ambulatory Visit | Attending: Orthopaedic Surgery | Admitting: Orthopaedic Surgery

## 2023-04-22 DIAGNOSIS — G8929 Other chronic pain: Secondary | ICD-10-CM

## 2023-04-26 ENCOUNTER — Ambulatory Visit: Payer: Managed Care, Other (non HMO) | Admitting: Orthopaedic Surgery

## 2023-04-26 ENCOUNTER — Encounter: Payer: Self-pay | Admitting: Orthopaedic Surgery

## 2023-04-26 DIAGNOSIS — G8929 Other chronic pain: Secondary | ICD-10-CM

## 2023-04-26 DIAGNOSIS — M25512 Pain in left shoulder: Secondary | ICD-10-CM

## 2023-04-26 NOTE — Progress Notes (Signed)
 The patient is a 53 year old female who comes in today to go over MRI of her left shoulder.  She has been dealing with left shoulder pain that has been chronic in nature.  It started sometime toward the end of last year.  I did place a steroid injection in her left shoulder subacromial outlet which did help somewhat.  She does work for [employer].  She is to the injection was helpful for just a little bit of time.  Examination of her left shoulder shows that she is able to abduct her shoulder fully and forward flex the shoulder.  She has actually decent strength in that left shoulder.  The MRI of her left shoulder however shows a full-thickness tear of the supraspinatus tendon.  There is not a lot of tendon retraction and there is no muscle atrophy at all.  I went over her shoulder model and describe what is going on with her left shoulder.  I would like to send her to my partner Dr. Steward Drone for his assessment of her left shoulder because I do feel that she is likely heading toward the surgical intervention given the MRI findings combined with her young age.  She is interested in seeing him as well.  The pain is waking her up at night.  We will work on getting that appointment made with him at his next available visit.  She agrees as well.

## 2023-05-02 ENCOUNTER — Ambulatory Visit (INDEPENDENT_AMBULATORY_CARE_PROVIDER_SITE_OTHER): Payer: Self-pay | Admitting: Licensed Clinical Social Worker

## 2023-05-02 DIAGNOSIS — F4323 Adjustment disorder with mixed anxiety and depressed mood: Secondary | ICD-10-CM | POA: Insufficient documentation
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
## 2023-05-02 NOTE — Progress Notes (Signed)
 [facility] Counselor/Therapist Progress Note  Patient ID: Angel Kemp, MRN: [id number]    Date: 05/02/23  TREATMENT PLAN Individualized Treatment Plan Strengths: Angel Kemp is caring and giving and this is evidenced in her taking care of both her Mother and her uncle.  Supports: Patient reports that she and her daughter Angel Kemp are very close.   Goal/Needs for Treatment:  In order of importance to patient 1) "I want to learn to build a new normal." 2) " I want to cope better with anxiety of being around others."    Client Statement of Needs: "I wan to build a new normal and cope with my anxiety and loss of my mother."   Treatment Level: Moderate  Symptoms: Anxiety, Depression, Irritability, excessive worry  Client Treatment Preferences: Face to Face/Cognitive Behavioral Therapy   Healthcare consumer's goal for treatment:  Counselor, Phyllis Ginger MSW will support the patient's ability to achieve the goals identified. Cognitive Behavioral Therapy, Assertive Communication/Conflict Resolution Training, Relaxation Training, ACT, Humanistic and other evidenced-based practices will be used to promote progress towards healthy functioning.   Healthcare consumer will: Actively participate in therapy, working towards healthy functioning.    *Justification for Continuation/Discontinuation of Goal: R=Revised, O=Ongoing, A=Achieved, D=Discontinued  Goal 1)  "I want to learn to build a new normal." Baseline date 05/02/2023: Progress towards goal Ongoing ; How Often - Daily Target Date Goal Was reviewed Status Code Progress towards goal/Likert rating  05/01/2024  O             1. Processing and Acknowledging Grief: Accepting the Reality of the Loss: This involves acknowledging the death and its impact on your life, rather than denying or suppressing the emotions associated with it.  Validating and Processing Emotions: Grief can manifest in many ways, including sadness, anger,  guilt, and numbness. Therapy can provide a safe space to explore and process these emotions.  Identifying and Addressing Potential Complications: Some individuals may experience complicated grief, such as prolonged or intense grief that interferes with daily life. Therapy can help identify and address these complications.  Finding Meaning and Purpose: Therapy can help you explore your values, beliefs, and goals, and find a new sense of purpose and meaning in life after the loss.  2. Developing Coping Skills: Learning Healthy Coping Mechanisms: This includes developing strategies for managing stress, anxiety, and other difficult emotions.  Improving Self-Care: Grief can be physically and emotionally draining, so therapy can help you prioritize self-care and build healthy habits.  Setting Realistic Goals: It's important to set achievable goals for yourself, rather than trying to "get over" the loss too quickly.  Building Resilience: Therapy can help you develop resilience, which is the ability to bounce back from adversity and find strength in difficult times.  3. Adapting to a New Reality: Adjusting to New Roles and Responsibilities: The loss of a loved one can lead to significant changes in your daily life and responsibilities.  Rebuilding Relationships: Therapy can help you navigate changes in your relationships with family and friends, and find new connections.  Creating New Routines and Rituals: Establishing new routines and rituals can help you find a sense of normalcy and structure in your life.  Honoring the Memory of the Deceased: Therapy can help you find healthy ways to remember and honor your loved one, while also moving forward with your life.  Finding a New Normal: The goal is not to erase the past or forget your loved one, but to find a way to live a  fulfilling life while honoring their memory.    Goal 2) " I want to cope better with anxiety of being around others." Baseline  date 05/02/2023 Progress towards goal Ongoing How Often - Daily Target Date Goal Was reviewed Status Code Progress towards goal  05/01/2024  O                Symptom Reduction: Alleviate the intensity and frequency of anxiety symptoms, enabling individuals to experience a significant decrease in worry, fear, and physical discomfort. Improved Coping Skills: Utilize effective coping strategies to manage stress and anxiety triggers. This includes teaching relaxation techniques, problem-solving skills, and healthy lifestyle habits. Enhanced Emotional Regulation: Help  develop better emotional regulation, allowing patient to handle stressful situations with greater calmness and stability. Increased Resilience: Build resilience to adapt to and recover from stress and adversity more effectively. This fosters a sense of strength and self-efficacy. Reduced Avoidance Behaviors: confront and gradually reduce avoidance behaviors that may be limiting daily activities and overall quality of life    This plan will be reviewed at least every 12 months. Date Behavioral Health Clinician Date Guardian/Patient   05/02/2023 Phyllis Ginger MSW,LCSW  05/02/2023 Verbal Consent Provided                 Patient will continue with bi-weekly therapy. Treatment plan to be reviewed by 05/01/2024. Interventions: Cognitive Behavioral Therapy  Diagnosis: Adjustment Disorder with mixed depressed and anxious mood   Phyllis Ginger MSW, LCSW/DATE 05/02/2023

## 2023-05-03 ENCOUNTER — Other Ambulatory Visit (HOSPITAL_BASED_OUTPATIENT_CLINIC_OR_DEPARTMENT_OTHER): Payer: Self-pay

## 2023-05-03 ENCOUNTER — Ambulatory Visit (HOSPITAL_BASED_OUTPATIENT_CLINIC_OR_DEPARTMENT_OTHER): Payer: Self-pay | Admitting: Orthopaedic Surgery

## 2023-05-03 ENCOUNTER — Encounter (HOSPITAL_BASED_OUTPATIENT_CLINIC_OR_DEPARTMENT_OTHER): Payer: Self-pay | Admitting: Orthopaedic Surgery

## 2023-05-03 ENCOUNTER — Ambulatory Visit (HOSPITAL_BASED_OUTPATIENT_CLINIC_OR_DEPARTMENT_OTHER): Admitting: Orthopaedic Surgery

## 2023-05-03 DIAGNOSIS — M75122 Complete rotator cuff tear or rupture of left shoulder, not specified as traumatic: Secondary | ICD-10-CM | POA: Diagnosis not present

## 2023-05-03 MED ORDER — OXYCODONE HCL 5 MG PO TABS
5.0000 mg | ORAL_TABLET | ORAL | 0 refills | Status: DC | PRN
  Filled 2023-05-03: qty 20, 4d supply, fill #0

## 2023-05-03 MED ORDER — IBUPROFEN 800 MG PO TABS
800.0000 mg | ORAL_TABLET | Freq: Three times a day (TID) | ORAL | 0 refills | Status: AC
  Filled 2023-05-03: qty 30, 10d supply, fill #0

## 2023-05-03 MED ORDER — ASPIRIN 325 MG PO TBEC
325.0000 mg | DELAYED_RELEASE_TABLET | Freq: Every day | ORAL | 0 refills | Status: AC
  Filled 2023-05-03: qty 30, 30d supply, fill #0

## 2023-05-03 MED ORDER — ACETAMINOPHEN 500 MG PO TABS
500.0000 mg | ORAL_TABLET | Freq: Three times a day (TID) | ORAL | 0 refills | Status: AC
  Filled 2023-05-03: qty 30, 10d supply, fill #0

## 2023-05-03 MED ORDER — TRAMADOL HCL 50 MG PO TABS
50.0000 mg | ORAL_TABLET | Freq: Four times a day (QID) | ORAL | 0 refills | Status: DC | PRN
  Filled 2023-05-03: qty 15, 4d supply, fill #0

## 2023-05-03 NOTE — Progress Notes (Signed)
 Chief Complaint: Left shoulder pain     History of Present Illness:    Angel Kemp is a 53 y.o. female dominant female presents with ongoing left shoulder pain for the last several years.  She does not have any specific injury although has had progressive and worsening pain over the left shoulder and extremely difficult time laying on the side.  She has previously participated in physical therapy.  She has previously had an injection with Dr. Magnus Ivan which gave her some relief but now this has dissipated.  She is here today for further discussion.  She is having pain with overhead motion.  She works in [employer].  She is having a very difficult time with ADLs as a result of her shoulder pain    PMH/PSH/Family History/Social History/Meds/Allergies:    Past Medical History:  Diagnosis Date   Allergy    Arthritis    Cough with hemoptysis 09/21/2011   Goiter    History of urinary urgency    Hypertension    Hyperthyroidism    Mass in chest 09/21/2011   Past Surgical History:  Procedure Laterality Date   TUBAL LIGATION     VIDEO BRONCHOSCOPY  09/23/2011   Procedure: VIDEO BRONCHOSCOPY WITHOUT FLUORO;  Surgeon: Lonia Farber, MD;  Location: [facility];  Service: Cardiopulmonary;  Laterality: Bilateral;   WISDOM TOOTH EXTRACTION     Social History   Socioeconomic History   Marital status: Single    Spouse name: Not on file   Number of children: Not on file   Years of education: Not on file   Highest education level: Associate degree: occupational, Scientist, product/process development, or vocational program  Occupational History   Not on file  Tobacco Use   Smoking status: Former    Current packs/day: 0.00    Types: Cigarettes    Quit date: 07/07/2011    Years since quitting: 11.8   Smokeless tobacco: Never  Vaping Use   Vaping status: Never Used  Substance and Sexual Activity   Alcohol use: No   Drug use: No   Sexual activity: Yes    Birth control/protection:  Post-menopausal  Other Topics Concern   Not on file  Social History Narrative   Not on file   Social Drivers of Health   Financial Resource Strain: Medium Risk (02/15/2023)   Overall Financial Resource Strain (CARDIA)    Difficulty of Paying Living Expenses: Somewhat hard  Food Insecurity: Food Insecurity Present (02/15/2023)   Hunger Vital Sign    Worried About Running Out of Food in the Last Year: Sometimes true    Ran Out of Food in the Last Year: Sometimes true  Transportation Needs: No Transportation Needs (02/15/2023)   PRAPARE - Administrator, Civil Service (Medical): No    Lack of Transportation (Non-Medical): No  Physical Activity: Inactive (02/15/2023)   Exercise Vital Sign    Days of Exercise per Week: 0 days    Minutes of Exercise per Session: 10 min  Stress: Stress Concern Present (02/15/2023)   [employer] of Occupational Health - Occupational Stress Questionnaire    Feeling of Stress : Very much  Social Connections: Unknown (02/15/2023)   Social Connection and Isolation Panel [NHANES]    Frequency of Communication with Friends and Family: Patient declined    Frequency of Social Gatherings with Friends and Family: Patient declined    Attends Religious Services: Never    Database administrator or Organizations: No    Attends [employer]  or Organization Meetings: Not on file    Marital Status: Never married  Recent Concern: Social Connections - Socially Isolated (12/05/2022)   Social Connection and Isolation Panel [NHANES]    Frequency of Communication with Friends and Family: More than three times a week    Frequency of Social Gatherings with Friends and Family: More than three times a week    Attends Religious Services: Never    Database administrator or Organizations: No    Attends Engineer, structural: Not on file    Marital Status: Never married   Family History  Problem Relation Age of Onset   Cancer Mother        Lung   Hypertension Mother     Diabetes type II Mother    Colon cancer Neg Hx    Rectal cancer Neg Hx    Stomach cancer Neg Hx    Esophageal cancer Neg Hx    Allergies  Allergen Reactions   Latex Itching and Rash   Current Outpatient Medications  Medication Sig Dispense Refill   acetaminophen (TYLENOL) 500 MG tablet Take 1 tablet (500 mg total) by mouth every 8 (eight) hours for 10 days. 30 tablet 0   aspirin EC 325 MG tablet Take 1 tablet (325 mg total) by mouth daily. 30 tablet 0   ibuprofen (ADVIL) 800 MG tablet Take 1 tablet (800 mg total) by mouth every 8 (eight) hours for 10 days. Please take with food, please alternate with acetaminophen 30 tablet 0   oxyCODONE (ROXICODONE) 5 MG immediate release tablet Take 1 tablet (5 mg total) by mouth every 4 (four) hours as needed for severe pain (pain score 7-10) or breakthrough pain. 20 tablet 0   traMADol (ULTRAM) 50 MG tablet Take 1 tablet (50 mg total) by mouth every 6 (six) hours as needed. 15 tablet 0   acetaminophen (TYLENOL) 325 MG tablet Take 650 mg by mouth every 6 (six) hours as needed.     fluticasone (FLONASE) 50 MCG/ACT nasal spray Place 2 sprays into both nostrils daily. 16 g 3   zolpidem (AMBIEN) 5 MG tablet Take 1 tablet (5 mg total) by mouth at bedtime as needed for sleep. 15 tablet 1   No current facility-administered medications for this visit.   No results found.  Review of Systems:   A ROS was performed including pertinent positives and negatives as documented in the HPI.  Physical Exam :   Constitutional: NAD and appears stated age Neurological: Alert and oriented Psych: Appropriate affect and cooperative There were no vitals taken for this visit.   Comprehensive Musculoskeletal Exam:    Left shoulder with forward elevation to 150 degrees compared to 170 on the contralateral.  External rotation at side is to 30 compared to 45 on the contralateral side.  Internal rotation is to L5 1 bilaterally.  Negative belly press positive Neer impingement  with 4 out of 5 strength in forward flexion   Imaging:   Xray (left shoulder): Normal  MRI (left shoulder): Full-thickness tearing of the anterior border of the supraspinatus   I personally reviewed and interpreted the radiographs.   Assessment and Plan:   53 y.o. 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  At this time she has trialed both physical therapy for strengthening as well as an injection which has not given her prolonged relief.  At this time this is very difficult for her to lay directly on the side or with overhead motion.  This is interfering with her job as well.  At this time given the fact that she is not experiencing any significant relief with conservative management we did discuss possibility of left shoulder arthroscopy with rotator cuff repair.  I did discuss the risks and limitations as well as associated rehab.  At this time she would like to proceed  -Plan for left shoulder arthroscopy with rotator cuff repair   After a lengthy discussion of treatment options, including risks, benefits, alternatives, complications of surgical and nonsurgical conservative options, the patient elected surgical repair.   The patient  is aware of the material risks  and complications including, but not limited to injury to adjacent structures, neurovascular injury, infection, numbness, bleeding, implant failure, thermal burns, stiffness, persistent pain, failure to heal, disease transmission from allograft, need for further surgery, dislocation, anesthetic risks, blood clots, risks of death,and others. The probabilities of surgical success and failure discussed with patient given their particular co-morbidities.The time and nature of expected rehabilitation and recovery was discussed.The patient's questions were all answered preoperatively.  No barriers to understanding were noted. I explained the natural history of the disease process and Rx rationale.  I  explained to the patient what I considered to be reasonable expectations given their personal situation.  The final treatment plan was arrived at through a shared patient decision making process model.    I personally saw and evaluated the patient, and participated in the management and treatment plan.  Huel Cote, MD Attending Physician, Orthopedic Surgery  This document was dictated using Dragon voice recognition software. A reasonable attempt at proof reading has been made to minimize errors.

## 2023-05-05 ENCOUNTER — Ambulatory Visit (INDEPENDENT_AMBULATORY_CARE_PROVIDER_SITE_OTHER): Admitting: Family Medicine

## 2023-05-05 ENCOUNTER — Telehealth: Payer: Self-pay | Admitting: Family Medicine

## 2023-05-05 ENCOUNTER — Encounter: Payer: Self-pay | Admitting: Family Medicine

## 2023-05-05 VITALS — BP 130/78 | HR 81 | Temp 97.7°F | Ht 64.0 in | Wt 186.6 lb

## 2023-05-05 DIAGNOSIS — H9313 Tinnitus, bilateral: Secondary | ICD-10-CM | POA: Diagnosis not present

## 2023-05-05 DIAGNOSIS — J301 Allergic rhinitis due to pollen: Secondary | ICD-10-CM | POA: Insufficient documentation

## 2023-05-05 MED ORDER — FLUTICASONE PROPIONATE 50 MCG/ACT NA SUSP: 2.0000 | Freq: Every day | NASAL | 3 refills | Status: AC

## 2023-05-05 NOTE — Assessment & Plan Note (Signed)
 Reviewed common cause of tinnitus being chronic noise exposure. I advised her towards hearing protection. Recommend expectant management for now.

## 2023-05-05 NOTE — Telephone Encounter (Signed)
error 

## 2023-05-05 NOTE — Progress Notes (Signed)
 [facility] [address] [place] [address] Dept: [phone] Dept Fax: [phone]  Office Visit  Subjective:    Patient ID: Angel Kemp, female    DOB: March 05, 1970, 53 y.o..   MRN: [id number]  Chief Complaint  Patient presents with   Tinnitus    C/o having ringing in LT ear.     History of Present Illness:  Patient is in today for evaluation of ringing in her ears. She notes this has been present for years now. It is not necessarily getting worse. It is intermittent. She has not noted any hearing decrease and denies any vertigo. She does not take aspirins. She has had some nasal allergy symptom.  Past Medical History: Patient Active Problem List   Diagnosis Date Noted   Tinnitus of both ears 05/05/2023   Seasonal allergic rhinitis due to pollen 05/05/2023   Adjustment disorder with mixed anxiety and depressed mood 05/02/2023   Bereavement 04/12/2023   Mass of cervix 02/15/2023   Bilateral shoulder region arthritis 11/18/2022   Hidradenitis axillaris 06/24/2020   Chronic midline low back pain without sciatica 04/12/2017   Multiple thyroid nodules 10/27/2016   Graves disease 10/27/2016   Urge incontinence 06/02/2016   Chest pain 07/23/2012   Hemoptysis 09/21/2011   Mediastinal mass 09/21/2011   Past Surgical History:  Procedure Laterality Date   TUBAL LIGATION     VIDEO BRONCHOSCOPY  09/23/2011   Procedure: VIDEO BRONCHOSCOPY WITHOUT FLUORO;  Surgeon: Lonia Farber, MD;  Location: [facility] ENDOSCOPY;  Service: Cardiopulmonary;  Laterality: Bilateral;   WISDOM TOOTH EXTRACTION     Family History  Problem Relation Age of Onset   Cancer Mother        Lung   Hypertension Mother    Diabetes type II Mother    Colon cancer Neg Hx    Rectal cancer Neg Hx    Stomach cancer Neg Hx    Esophageal cancer Neg Hx    Outpatient Medications Prior to Visit  Medication Sig Dispense Refill   acetaminophen  (TYLENOL) 325 MG tablet Take 650 mg by mouth every 6 (six) hours as needed.     acetaminophen (TYLENOL) 500 MG tablet Take 1 tablet (500 mg total) by mouth every 8 (eight) hours for 10 days. 30 tablet 0   aspirin EC 325 MG tablet Take 1 tablet (325 mg total) by mouth daily. 30 tablet 0   ibuprofen (ADVIL) 800 MG tablet Take 1 tablet (800 mg total) by mouth every 8 (eight) hours for 10 days. Please take with food, please alternate with acetaminophen 30 tablet 0   oxyCODONE (ROXICODONE) 5 MG immediate release tablet Take 1 tablet (5 mg total) by mouth every 4 (four) hours as needed for severe pain (pain score 7-10) or breakthrough pain. 20 tablet 0   traMADol (ULTRAM) 50 MG tablet Take 1 tablet (50 mg total) by mouth every 6 (six) hours as needed. 15 tablet 0   zolpidem (AMBIEN) 5 MG tablet Take 1 tablet (5 mg total) by mouth at bedtime as needed for sleep. 15 tablet 1   fluticasone (FLONASE) 50 MCG/ACT nasal spray Place 2 sprays into both nostrils daily. 16 g 3   No facility-administered medications prior to visit.   Allergies  Allergen Reactions   Latex Itching and Rash     Objective:   Today's Vitals   05/05/23 1303  BP: 130/78  Pulse: 81  Temp: 97.7 F (36.5 C)  TempSrc: Temporal  SpO2: 98%  Weight: 186 lb 9.6 oz (84.6 kg)  Height: 5\' 4"  (1.626 m)   Body mass index is 32.03 kg/m.   General: Well developed, well nourished. No acute distress. HEENT: Normocephalic, non-traumatic. External ears normal. EAC and TMs normal bilaterally. Nose    clear without congestion or rhinorrhea. Mucous membranes moist. Oropharynx clear. Good dentition. Psych: Alert and oriented. Normal mood and affect.  Health Maintenance Due  Topic Date Due   Hepatitis C Screening  Never done   Lung Cancer Screening  08/11/2019     Assessment & Plan:   Problem List Items Addressed This Visit       Respiratory   Seasonal allergic rhinitis due to pollen   I will prescribe Zyrtec for her seasonal allergy  symptoms.      Relevant Medications   fluticasone (FLONASE) 50 MCG/ACT nasal spray     Other   Tinnitus of both ears - Primary   Reviewed common cause of tinnitus being chronic noise exposure. I advised her towards hearing protection. Recommend expectant management for now.       Return if symptoms worsen or fail to improve.   Loyola Mast, MD

## 2023-05-05 NOTE — Patient Instructions (Signed)
 Tinnitus Tinnitus is when you hear a sound that there's no actual source for. It may sound like ringing in your ears or something else. The sound may be loud, soft, or somewhere in between. It can last for a few seconds or be constant for days. It can come and go. Almost everyone has tinnitus at some point. It's not the same as hearing loss. But you may need to see a health care provider if: It lasts for a long time. It comes back often. You have trouble sleeping and focusing. What are the causes? The cause of tinnitus is often unknown. In some cases, you may get it if: You're around loud noises, such as from machines or music. An object gets stuck in your ear. Earwax builds up in Landscape architect. You drink a lot of alcohol or caffeine. You take certain medicines. You start to lose your hearing. You may also get it from some medical conditions. These may include: Ear or sinus infections. Heart diseases. High blood pressure. Allergies. Mnire's disease. Problems with your thyroid. A tumor. This is a growth of cells that isn't normal. A weak, bulging blood vessel called an aneurysm near your ear. What increases the risk? You may be more likely to get tinnitus if: You're around loud noises a lot. You're older. You drink alcohol. You smoke. What are the signs or symptoms? The main symptom is hearing a sound that there's no source for. It may sound like ringing. It may also sound like: Buzzing. Sizzling. Blowing air. Hissing. Whistling. Other sounds may include: Roaring. Running water. A musical note. Tapping. Humming. You may have symptoms in one ear or both ears. How is this diagnosed? Tinnitus is diagnosed based on your symptoms, your medical history, and an exam. Your provider may do a full hearing test if your tinnitus: Is in just one ear. Makes it hard for you to hear. Lasts 6 months or longer. You may also need to see an expert in hearing disorders called an audiologist.  They may ask you about your symptoms and how tinnitus affects your daily life. You may have other tests done. These may include: A CT scan. An MRI. An angiogram. This shows how blood flows through your blood vessels. How is this treated? Treatment may include: Therapy to help you manage the stress of living with tinnitus. Finding ways to mask or cover the sound of tinnitus. These include: Sound or white noise machines. Devices that fit in your ear and play sounds or music. A loud humidifier. Acoustic neural stimulation. This is when you use headphones to listen to music that has a special signal in it. Over time, this signal may change some of the pathways in your brain. This can make you less sensitive to tinnitus. This treatment is used for very severe cases. Using hearing aids or cochlear implants if your tinnitus is from hearing loss. If your tinnitus is caused by a medical condition, treating the condition may make it go away.  Follow these instructions at home: Managing symptoms     Try to avoid being in loud places or around loud noises. Wear earplugs or headphones when you're around loud noises. Find ways to reduce stress. These may include meditation, yoga, or deep breathing. Sleep with your head slightly raised. General instructions Take over-the-counter and prescription medicines only as told by your provider. Track the things that cause symptoms (triggers). Try to avoid these things. To stop your tinnitus from getting worse: Do not drink alcohol. Do  not have caffeine. Do not use any products that contain nicotine or tobacco. These products include cigarettes, chewing tobacco, and vaping devices, such as e-cigarettes. If you need help quitting, ask your provider. Avoid using too much salt. Get enough sleep each night. Where to find more information American Tinnitus Association: https://www.johnson-hamilton.org/ Contact a health care provider if: Your symptoms last for 3 weeks or longer without  stopping. You have sudden hearing loss. Your symptoms get worse or don't get better with home care. You can't manage the stress of living with tinnitus. Get help right away if: You get tinnitus after a head injury. You have tinnitus and: Dizziness. Nausea and vomiting. Loss of balance. A sudden, severe headache. Changes to your eyesight. Weakness in your face, arms, or legs. These symptoms may be an emergency. Get help right away. Call 911. Do not wait to see if the symptoms will go away. Do not drive yourself to the hospital. This information is not intended to replace advice given to you by your health care provider. Make sure you discuss any questions you have with your health care provider. Document Revised: 05/02/2022 Document Reviewed: 05/02/2022 Elsevier Patient Education  2024 [employer].

## 2023-05-05 NOTE — Assessment & Plan Note (Signed)
 I will prescribe Zyrtec for her seasonal allergy symptoms.

## 2023-05-10 ENCOUNTER — Ambulatory Visit: Admitting: Licensed Clinical Social Worker

## 2023-05-16 NOTE — Progress Notes (Addendum)
 ?  NEW GYNECOLOGY PATIENT Patient name: Angel Kemp MRN [id number]  Date of birth: Mar 18, 1970 Chief Complaint:   Establish Care (Cervical Mass)     History:  Angel Kemp is a 53 y.o. No obstetric history on file. being seen today for cervical mass found on PCP visit.    Discussed the use of AI scribe software for clinical note transcription with the patient, who gave verbal consent to proceed.  History of Present Illness Angel Kemp is a 53 year old female who presents with postmenopausal bleeding and an abnormal cervical finding.  She presents following an abnormal finding on her cervix during a routine Pap smear, with no history of abnormal Pap smears prior. No associated cervical pain or abnormal vaginal discharge is noted. She is not currently sexually active.  Approximately 8-9 months ago, she experienced an episode of unusual bleeding, lasting about three days, described as heavy and dark. This was atypical as she has been menopausal since around 2015 or 2016, with no periods for several years prior. No general pelvic discomfort or pain is reported, though she describes a sensation similar to ovulation.  She reports urinary incontinence characterized by urgency and occasional leakage with coughing, sneezing, or laughing, indicating a mixed urinary incontinence pattern. No unintentional weight changes are noted, with any fluctuations attributed to her hyperthyroidism.  She has hyperthyroidism but has not been on medication for a couple of years, as she felt the medication was not effective and preferred to manage it without medication. She has not had recent thyroid  level checks.       Gynecologic History No LMP recorded. Patient is premenopausal. Contraception: post menopausal status Last Pap:     Component Value Date/Time   DIAGPAP  02/15/2023 1421    - Negative for intraepithelial lesion or malignancy (NILM)   HPVHIGH Negative 02/15/2023 1421   ADEQPAP   02/15/2023 1421    Satisfactory for evaluation; transformation zone component PRESENT.    Last Mammogram:  12/2022 BIRADS 1 Last Colonoscopy:  12/2023  Obstetric History OB History  No obstetric history on file.    Past Medical History:  Diagnosis Date   Allergy    Arthritis    Cough with hemoptysis 09/21/2011   Goiter    History of urinary urgency    Hypertension    Hyperthyroidism    Mass in chest 09/21/2011    Past Surgical History:  Procedure Laterality Date   TUBAL LIGATION     VIDEO BRONCHOSCOPY  09/23/2011   Procedure: VIDEO BRONCHOSCOPY WITHOUT FLUORO;  Surgeon: Florian Hint, MD;  Location: [facility] ENDOSCOPY;  Service: Cardiopulmonary;  Laterality: Bilateral;   WISDOM TOOTH EXTRACTION      Current Outpatient Medications on File Prior to Visit  Medication Sig Dispense Refill   acetaminophen  (TYLENOL ) 325 MG tablet Take 650 mg by mouth every 6 (six) hours as needed.     aspirin  EC 325 MG tablet Take 1 tablet (325 mg total) by mouth daily. 30 tablet 0   fluticasone  (FLONASE ) 50 MCG/ACT nasal spray Place 2 sprays into both nostrils daily. 16 g 3   oxyCODONE  (ROXICODONE ) 5 MG immediate release tablet Take 1 tablet (5 mg total) by mouth every 4 (four) hours as needed for severe pain (pain score 7-10) or breakthrough pain. 20 tablet 0   traMADol  (ULTRAM ) 50 MG tablet Take 1 tablet (50 mg total) by mouth every 6 (six) hours as needed. 15 tablet 0   zolpidem  (AMBIEN ) 5 MG tablet Take 1  tablet (5 mg total) by mouth at bedtime as needed for sleep. 15 tablet 1   No current facility-administered medications on file prior to visit.    Allergies  Allergen Reactions   Latex Itching and Rash    Social History:  reports that she quit smoking about 11 years ago. Her smoking use included cigarettes. She has never used smokeless tobacco. She reports that she does not drink alcohol and does not use drugs.  Family History  Problem Relation Age of Onset   Cancer Mother         Lung   Hypertension Mother    Diabetes type II Mother    Colon cancer Neg Hx    Rectal cancer Neg Hx    Stomach cancer Neg Hx    Esophageal cancer Neg Hx     The following portions of the patient's history were reviewed and updated as appropriate: allergies, current medications, past family history, past medical history, past social history, past surgical history and problem list.  Review of Systems Pertinent items noted in HPI and remainder of comprehensive ROS otherwise negative.  Physical Exam:  BP (!) 148/83   Pulse 71   Ht 5' 5 (1.651 m)   Wt 185 lb 14.4 oz (84.3 kg)   BMI 30.94 kg/m  Physical Exam Vitals and nursing note reviewed. Exam conducted with a chaperone present.  Constitutional:      Appearance: Normal appearance.  Pulmonary:     Effort: Pulmonary effort is normal.  Abdominal:     Palpations: Abdomen is soft.  Genitourinary:    General: Normal vulva.     Exam position: Lithotomy position.     Vagina: Normal.     Comments: Erythematous lesion on posterior lip of cervix, nontender Firm cervix  Neurological:     Mental Status: She is alert.    Cervical Biopsy Procedure Patient gave informed written consent, time out was performed.  Placed in lithotomy position. Cervix viewed with speculum. The cervix was sprayed with hurricane spray and cleaned with betadine. Cervical biopsy was taken from 6 o'clock using Tischler forceps. ECC was then collected.  All specimens were labeled and sent to pathology.  Chaperone was present during entire procedure.  Endometrial Biopsy Procedure  Patient identified, informed consent performed,  indication reviewed, consent signed.  Reviewed risk of perforation, pain, bleeding, insufficient sample, etc were reviewd. Time out was performed.  Urine pregnancy test negative.  Following completion of the cervical biopsy, proceeded with endometrial biopsy. Endocervical canal was gently dilated with disposable dilator after pipelle  was not initially able to be passed.  Endometrial pipelle was used to draw up 1cc of 1% lidocaine , introduced into the cervical os and instilled into the endometrial cavity.  The pipelle was passed twice without difficulty and sample obtained. Tenaculum was removed, good hemostasis noted.  Patient tolerated procedure well.     Assessment and Plan:   Assessment and Plan Assessment & Plan Abnormal Cervical Lesion and Postmenopausal bleeding Abnormal cervical lesion with postmenopausal bleeding. Differential includes cervical dysplasia or malignancy. Biopsy required for evaluation. - Now s/p uncomplicated cervical biopsy with ECC and endometrial biopsy - Educated on potential cramping and post-procedure bleeding.  Urinary Incontinence Urinary incontinence with urgency and stress components. Pelvic physical therapy may benefit. - Consider pelvic physical therapy pending surgical pathology results  Hyperthyroidism Hyperthyroidism with self-discontinuation of medication. No recent thyroid  function tests or endocrinology follow-up. - Recommend follow-up with primary care for thyroid  function tests and management.  Routine preventative health maintenance measures emphasized. Please refer to After Visit Summary for other counseling recommendations.   Follow-up: No follow-ups on file.      Carter Quarry, MD Obstetrician & Gynecologist, Faculty Practice Minimally [facility]vasive Gynecologic [facility] for [employer], [facility]

## 2023-05-17 ENCOUNTER — Encounter: Payer: Self-pay | Admitting: Obstetrics and Gynecology

## 2023-05-17 ENCOUNTER — Other Ambulatory Visit (HOSPITAL_COMMUNITY)
Admission: RE | Admit: 2023-05-17 | Discharge: 2023-05-17 | Disposition: A | Discharge status: Home / Self Care | Source: Ambulatory Visit | Attending: Obstetrics and Gynecology | Admitting: Obstetrics and Gynecology

## 2023-05-17 ENCOUNTER — Ambulatory Visit: Admitting: Obstetrics and Gynecology

## 2023-05-17 VITALS — BP 148/83 | HR 71 | Ht 65.0 in | Wt 185.9 lb

## 2023-05-17 DIAGNOSIS — N888 Other specified noninflammatory disorders of cervix uteri: Secondary | ICD-10-CM | POA: Diagnosis not present

## 2023-05-17 DIAGNOSIS — E0500 Thyrotoxicosis with diffuse goiter without thyrotoxic crisis or storm: Secondary | ICD-10-CM

## 2023-05-17 DIAGNOSIS — N950 Postmenopausal bleeding: Secondary | ICD-10-CM

## 2023-05-17 DIAGNOSIS — Z1339 Encounter for screening examination for other mental health and behavioral disorders: Secondary | ICD-10-CM | POA: Diagnosis not present

## 2023-05-17 NOTE — Progress Notes (Signed)
 Pt presents for cervical mass. Pt has no questions or concerns at this time (may have later on in visit)

## 2023-05-19 LAB — SURGICAL PATHOLOGY

## 2023-05-22 ENCOUNTER — Telehealth: Payer: Self-pay | Admitting: Obstetrics and Gynecology

## 2023-05-22 NOTE — Telephone Encounter (Signed)
 Telephone call to patient regarding endometrial biopsy results and to notify her of need to recollect cervical biopsy.  Patient understood and is agreeable to recollection.  Scheduled for repeat biopsy 05/24/23.

## 2023-05-24 ENCOUNTER — Ambulatory Visit: Admitting: Obstetrics and Gynecology

## 2023-05-24 ENCOUNTER — Other Ambulatory Visit (HOSPITAL_COMMUNITY)
Admission: RE | Admit: 2023-05-24 | Discharge: 2023-05-24 | Disposition: A | Discharge status: Home / Self Care | Source: Ambulatory Visit | Attending: Obstetrics and Gynecology | Admitting: Obstetrics and Gynecology

## 2023-05-24 ENCOUNTER — Encounter: Payer: Self-pay | Admitting: Obstetrics and Gynecology

## 2023-05-24 ENCOUNTER — Other Ambulatory Visit: Payer: Self-pay | Admitting: Obstetrics and Gynecology

## 2023-05-24 VITALS — BP 170/93 | HR 68 | Wt 183.6 lb

## 2023-05-24 DIAGNOSIS — N888 Other specified noninflammatory disorders of cervix uteri: Secondary | ICD-10-CM

## 2023-05-24 NOTE — Progress Notes (Unsigned)
    GYNECOLOGY VISIT  Patient name: Angel Kemp MRN [id number]  Date of birth: 1970/07/09 Chief Complaint:   Procedure  History:  Angel Kemp is a 53 y.o. No obstetric history on file. being seen today for cervical biopsy after initial specimen lost.    The following portions of the patient's history were reviewed and updated as appropriate: allergies, current medications, past family history, past medical history, past social history, past surgical history and problem list.   Health Maintenance:   Last pap     Component Value Date/Time   DIAGPAP  02/15/2023 1421    - Negative for intraepithelial lesion or malignancy (NILM)   HPVHIGH Negative 02/15/2023 1421   ADEQPAP  02/15/2023 1421    Satisfactory for evaluation; transformation zone component PRESENT.    Last mammogram: 11/2022 BIRADS 1   Review of Systems:  Pertinent items are noted in HPI. Comprehensive review of systems was otherwise negative.   Objective:  Physical Exam BP (!) 170/93   Pulse 68   Wt 183 lb 9.6 oz (83.3 kg)   BMI 30.55 kg/m    Physical Exam Vitals and nursing note reviewed. Exam conducted with a chaperone present.  Constitutional:      Appearance: Normal appearance.  HENT:     Head: Normocephalic and atraumatic.  Pulmonary:     Effort: Pulmonary effort is normal.     Breath sounds: Normal breath sounds.  Genitourinary:    General: Normal vulva.     Exam position: Lithotomy position.     Vagina: Normal.     Cervix: Normal.  Skin:    General: Skin is warm and dry.  Neurological:     General: No focal deficit present.     Mental Status: She is alert.  Psychiatric:        Mood and Affect: Mood normal.        Behavior: Behavior normal.        Thought Content: Thought content normal.        Judgment: Judgment normal.    Cervical Biopsy Procedure Patient gave informed written consent, time out was performed.  Placed in lithotomy position. Cervix viewed with speculum. The cervix  was cleaned with betadine. Cervical biopsy was taken from 5 and 7 o'clock using Tischler forceps. ECC was then collected.  All specimens were labeled and sent to pathology.   Chaperone was present during entire procedure.      Assessment & Plan:   1. Mass of cervix (Primary) Cervical biopsy with ECC collected - Surgical pathology  Routine preventative health maintenance measures emphasized.  Kiki Pelton, MD Minimally Invasive Gynecologic Surgery Center for [facility], [facility]

## 2023-05-26 LAB — SURGICAL PATHOLOGY

## 2023-05-29 ENCOUNTER — Encounter: Payer: Self-pay | Admitting: Obstetrics and Gynecology

## 2023-05-29 ENCOUNTER — Ambulatory Visit: Admitting: Licensed Clinical Social Worker

## 2023-06-20 ENCOUNTER — Ambulatory Visit: Admitting: Licensed Clinical Social Worker

## 2023-06-22 ENCOUNTER — Encounter (HOSPITAL_BASED_OUTPATIENT_CLINIC_OR_DEPARTMENT_OTHER): Payer: Self-pay | Admitting: Orthopaedic Surgery

## 2023-06-22 ENCOUNTER — Ambulatory Visit (HOSPITAL_BASED_OUTPATIENT_CLINIC_OR_DEPARTMENT_OTHER): Payer: Self-pay | Admitting: Orthopaedic Surgery

## 2023-06-22 DIAGNOSIS — M7542 Impingement syndrome of left shoulder: Secondary | ICD-10-CM | POA: Diagnosis not present

## 2023-06-22 NOTE — Progress Notes (Signed)
   Date of Surgery: 06/22/2023  INDICATIONS: Ms. Gregston is a 53 y.o.-year-old female with left shoulder rotator cuff impingement.  The risk and benefits of the procedure were discussed in detail and documented in the pre-operative evaluation.   PREOPERATIVE DIAGNOSES: Left shoulder, subacromial impingement.  POSTOPERATIVE DIAGNOSIS: Same.  PROCEDURE: Arthroscopic extensive debridement - 29823 Subdeltoid Bursa, Supraspinatus Tendon, Anterior Labrum, and rotator interval Arthroscopic subacromial decompression - 29826  SURGEON: Carmina Chris MD  ASSISTANT: Naida Austria, ATC  ANESTHESIA:  general  IV FLUIDS AND URINE: See anesthesia record.  ANTIBIOTICS: Ancef  ESTIMATED BLOOD LOSS: 10 mL.  IMPLANTS:  * No surgical log found *  DRAINS: None  CULTURES: None  COMPLICATIONS: none  PROCEDURE:    OPERATIVE FINDING: Exam under anesthesia:   Examination under anesthesia revealed forward elevation of 150 degrees.  With the arm at the side, there was 65 degrees of external rotation.  There is a 1+ anterior load shift and a 1+ posterior load shift.    Arthroscopic findings demonstrated: Articular space: Rotator interval inflamed, red Chondral surfaces: Normal Biceps: Intact Subscapularis: Intact Supraspinatus: Intact with undersurface fraying Infraspinatus: Intact    I identified the patient in the pre-operative holding area.  I marked the operative right shoulder with my initials. I reviewed the risks and benefits of the proposed surgical intervention and the patient wished to proceed.  Anesthesia was then performed with regional block.  The patient was transferred to the operative suite and placed in the beach chair position with all bony prominences padded.     SCDs were placed on bilateral lower extremity. Appropriate antibiotics was administered within 1 hour before incision.  Anesthesia was induced.  The operative extremity was then prepped and draped in standard  fashion. A time out was performed confirming the correct extremity, correct patient and correct procedure.   The arthroscope was introduced in the glenohumeral joint from a posterior portal.  An anterior portal was created.  The shoulder was examined and the above findings were noted.     With an arthroscopic shaver and a wand ablator, synovitis throughout the  shoulder was resected.  The arthroscopic shaver was used to excise torn portions of the labrum back to a stable margin. Specifically this was done for the anterior and superior labrum. The rotator interval was debrided using shaver and electrocautery.the undersurface of the rotator cuff was debrided at the footprint without any significant tearing   The rotator cuff was then approached through the subacromial space. Anterior, lateral, and posterior portals were used.  Bursectomy was performed with an arthroscopic shaver and [employer] wand.  The soft tissues and 1 mm of bone on the undersurface of the acromion and clavicle were resected with the arthroscopic shaver and wand.      POSTOPERATIVE PLAN: she'll be weightbearing as tolerated on the left arm with active range of motion as tolerated once her nerve block wears off.  She will be placed on aspirin  for 2 weeks of blood clot prevention  Carmina Chris, MD 2:22 PM

## 2023-06-28 ENCOUNTER — Encounter (HOSPITAL_BASED_OUTPATIENT_CLINIC_OR_DEPARTMENT_OTHER): Admitting: Orthopaedic Surgery

## 2023-07-07 ENCOUNTER — Ambulatory Visit (HOSPITAL_BASED_OUTPATIENT_CLINIC_OR_DEPARTMENT_OTHER): Admitting: Orthopaedic Surgery

## 2023-07-07 DIAGNOSIS — M75122 Complete rotator cuff tear or rupture of left shoulder, not specified as traumatic: Secondary | ICD-10-CM

## 2023-07-07 NOTE — Progress Notes (Signed)
 Post Operative Evaluation    Procedure/Date of Surgery: Left shoulder debridement 5/15  Interval History:    Presents 2 weeks status post the above procedure.  Overall she is doing quite well.  She has not been able to establish with physical therapy at this time.  Overall having little to no pain   PMH/PSH/Family History/Social History/Meds/Allergies:    Past Medical History:  Diagnosis Date   Allergy    Arthritis    Cough with hemoptysis 09/21/2011   Goiter    History of urinary urgency    Hypertension    Hyperthyroidism    Mass in chest 09/21/2011   Past Surgical History:  Procedure Laterality Date   TUBAL LIGATION     VIDEO BRONCHOSCOPY  09/23/2011   Procedure: VIDEO BRONCHOSCOPY WITHOUT FLUORO;  Surgeon: Leonia Raman, MD;  Location: [facility] ENDOSCOPY;  Service: Cardiopulmonary;  Laterality: Bilateral;   WISDOM TOOTH EXTRACTION     Social History   Socioeconomic History   Marital status: Single    Spouse name: Not on file   Number of children: Not on file   Years of education: Not on file   Highest education level: Associate degree: occupational, Scientist, product/process development, or vocational program  Occupational History   Not on file  Tobacco Use   Smoking status: Former    Current packs/day: 0.00    Types: Cigarettes    Quit date: 07/07/2011    Years since quitting: 12.0   Smokeless tobacco: Never  Vaping Use   Vaping status: Never Used  Substance and Sexual Activity   Alcohol use: No   Drug use: No   Sexual activity: Not Currently    Birth control/protection: Post-menopausal, Surgical  Other Topics Concern   Not on file  Social History Narrative   Not on file   Social Drivers of Health   Financial Resource Strain: Medium Risk (02/15/2023)   Overall Financial Resource Strain (CARDIA)    Difficulty of Paying Living Expenses: Somewhat hard  Food Insecurity: Food Insecurity Present (02/15/2023)   Hunger Vital Sign    Worried About  Running Out of Food in the Last Year: Sometimes true    Ran Out of Food in the Last Year: Sometimes true  Transportation Needs: No Transportation Needs (02/15/2023)   PRAPARE - Administrator, Civil Service (Medical): No    Lack of Transportation (Non-Medical): No  Physical Activity: Inactive (02/15/2023)   Exercise Vital Sign    Days of Exercise per Week: 0 days    Minutes of Exercise per Session: 10 min  Stress: Stress Concern Present (02/15/2023)   [employer] of Occupational Health - Occupational Stress Questionnaire    Feeling of Stress : Very much  Social Connections: Unknown (02/15/2023)   Social Connection and Isolation Panel [NHANES]    Frequency of Communication with Friends and Family: Patient declined    Frequency of Social Gatherings with Friends and Family: Patient declined    Attends Religious Services: Never    Database administrator or Organizations: No    Attends Engineer, structural: Not on file    Marital Status: Never married  Recent Concern: Social Connections - Socially Isolated (12/05/2022)   Social Connection and Isolation Panel [NHANES]    Frequency of Communication with Friends and Family: More than three times a week  Frequency of Social Gatherings with Friends and Family: More than three times a week    Attends Religious Services: Never    Database administrator or Organizations: No    Attends Engineer, structural: Not on file    Marital Status: Never married   Family History  Problem Relation Age of Onset   Cancer Mother        Lung   Hypertension Mother    Diabetes type II Mother    Colon cancer Neg Hx    Rectal cancer Neg Hx    Stomach cancer Neg Hx    Esophageal cancer Neg Hx    Allergies  Allergen Reactions   Latex Itching and Rash   Current Outpatient Medications  Medication Sig Dispense Refill   acetaminophen  (TYLENOL ) 325 MG tablet Take 650 mg by mouth every 6 (six) hours as needed.     aspirin  EC 325 MG  tablet Take 1 tablet (325 mg total) by mouth daily. 30 tablet 0   fluticasone  (FLONASE ) 50 MCG/ACT nasal spray Place 2 sprays into both nostrils daily. 16 g 3   oxyCODONE  (ROXICODONE ) 5 MG immediate release tablet Take 1 tablet (5 mg total) by mouth every 4 (four) hours as needed for severe pain (pain score 7-10) or breakthrough pain. (Patient not taking: Reported on 05/24/2023) 20 tablet 0   traMADol  (ULTRAM ) 50 MG tablet Take 1 tablet (50 mg total) by mouth every 6 (six) hours as needed. 15 tablet 0   zolpidem  (AMBIEN ) 5 MG tablet Take 1 tablet (5 mg total) by mouth at bedtime as needed for sleep. 15 tablet 1   No current facility-administered medications for this visit.   No results found.  Review of Systems:   A ROS was performed including pertinent positives and negatives as documented in the HPI.   Musculoskeletal Exam:    There were no vitals taken for this visit.  Left shoulder incisions are well-appearing without erythema or drainage.  In the spine position she can forward elevate to 90 degrees external rotation at 45 degrees.  Distal neurosensory exam is intact  Imaging:      I personally reviewed and interpreted the radiographs.   Assessment:   2-week status post left shoulder arthroscopic debridement overall doing well.  At this time she will continue to work on overhead range of motion as well as active range of motion and strengthening  Plan :    - Return to clinic in 4 weeks for reassessment      I personally saw and evaluated the patient, and participated in the management and treatment plan.  Wilhelmenia Harada, MD Attending Physician, Orthopedic Surgery  This document was dictated using Dragon voice recognition software. A reasonable attempt at proof reading has been made to minimize errors.

## 2023-07-12 ENCOUNTER — Ambulatory Visit: Payer: Self-pay | Attending: Orthopaedic Surgery

## 2023-07-12 ENCOUNTER — Other Ambulatory Visit: Payer: Self-pay

## 2023-07-12 DIAGNOSIS — M6281 Muscle weakness (generalized): Secondary | ICD-10-CM | POA: Diagnosis not present

## 2023-07-12 DIAGNOSIS — M75122 Complete rotator cuff tear or rupture of left shoulder, not specified as traumatic: Secondary | ICD-10-CM | POA: Diagnosis present

## 2023-07-12 DIAGNOSIS — M25612 Stiffness of left shoulder, not elsewhere classified: Secondary | ICD-10-CM | POA: Insufficient documentation

## 2023-07-12 DIAGNOSIS — M25512 Pain in left shoulder: Secondary | ICD-10-CM

## 2023-07-12 DIAGNOSIS — R252 Cramp and spasm: Secondary | ICD-10-CM

## 2023-07-12 NOTE — Therapy (Unsigned)
 OUTPATIENT PHYSICAL THERAPY UPPER EXTREMITY EVALUATION   Patient Name: Angel Kemp MRN: [id number] DOB:07-09-1970, 53 y.o., female Today's Date: 07/13/2023  END OF SESSION:  PT End of Session - 07/12/23 1551     Visit Number 1    Date for PT Re-Evaluation 09/06/23    Authorization Type [employer]    Progress Note Due on Visit 10    PT Start Time 1533    PT Stop Time 1615    PT Time Calculation (min) 42 min    Activity Tolerance Patient tolerated treatment well    Behavior During Therapy [facility] for tasks assessed/performed             Past Medical History:  Diagnosis Date   Allergy    Arthritis    Cough with hemoptysis 09/21/2011   Goiter    History of urinary urgency    Hypertension    Hyperthyroidism    Mass in chest 09/21/2011   Past Surgical History:  Procedure Laterality Date   TUBAL LIGATION     VIDEO BRONCHOSCOPY  09/23/2011   Procedure: VIDEO BRONCHOSCOPY WITHOUT FLUORO;  Surgeon: Leonia Raman, MD;  Location: [facility] ENDOSCOPY;  Service: Cardiopulmonary;  Laterality: Bilateral;   WISDOM TOOTH EXTRACTION     Patient Active Problem List   Diagnosis Date Noted   Tinnitus of both ears 05/05/2023   Seasonal allergic rhinitis due to pollen 05/05/2023   Adjustment disorder with mixed anxiety and depressed mood 05/02/2023   Bereavement 04/12/2023   Mass of cervix 02/15/2023   Bilateral shoulder region arthritis 11/18/2022   Hidradenitis axillaris 06/24/2020   Chronic midline low back pain without sciatica 04/12/2017   Multiple thyroid  nodules 10/27/2016   Graves disease 10/27/2016   Urge incontinence 06/02/2016   Chest pain 07/23/2012   Hemoptysis 09/21/2011   Mediastinal mass 09/21/2011    PCP: Dr. Thayer Finders  REFERRING PROVIDER: Dr. Hermina Loosen  REFERRING DIAG: [phone] (ICD-10-CM) - Complete rotator cuff tear or rupture of left shoulder, not specified as traumatic  THERAPY DIAG:  Acute pain of left shoulder - Plan: PT plan of care  cert/re-cert  Stiffness of left shoulder, not elsewhere classified - Plan: PT plan of care cert/re-cert  Muscle weakness (generalized) - Plan: PT plan of care cert/re-cert  Cramp and spasm - Plan: PT plan of care cert/re-cert  Rationale for Evaluation and Treatment: Rehabilitation  ONSET DATE: 05/11/2023  SUBJECTIVE:                                                                                                                                                                                      SUBJECTIVE STATEMENT: Patient reports  Hand dominance: Right  PERTINENT HISTORY: Per patient, surgeon initially suspected she would need full repair but during surgery, found that this was not the case and ended up having just debridement and SAD.    PAIN:  Are you having pain? Yes: NPRS scale: 4/10 now, at worst 8/10 Pain location: left shoulder Pain description: sharp if I try to reach up, aching  Aggravating factors: reaching , dressing , bathing Relieving factors: ice, tylenol  and pain medication  PRECAUTIONS: None  RED FLAGS: None   WEIGHT BEARING RESTRICTIONS: No  FALLS:  Has patient fallen in last 6 months? 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  DIAGNOSTIC FINDINGS:  na  PATIENT SURVEYS :  Quick Dash 70.5/100= 70.5%  COGNITION: Overall cognitive status: Within functional limits for tasks  assessed     SENSATION: WFL  POSTURE: Slightly rounded shoulder   UPPER EXTREMITY ROM:   Passive ROM Right eval Left eval  Shoulder flexion  100  Shoulder extension    Shoulder abduction  80  Shoulder adduction    Shoulder internal rotation  60  Shoulder external rotation  8  Elbow flexion    Elbow extension    Wrist flexion    Wrist extension    Wrist ulnar deviation    Wrist radial deviation    Wrist pronation    Wrist supination    (Blank rows = not tested)  UPPER EXTREMITY MMT:  MMT Right eval Left eval  Shoulder flexion 4 3  Shoulder extension    Shoulder abduction 4 3  Shoulder adduction    Shoulder internal rotation 4+ 3  Shoulder external rotation 4 3  Middle trapezius    Lower trapezius    Elbow flexion    Elbow extension    Wrist flexion    Wrist extension    Wrist ulnar deviation    Wrist radial deviation    Wrist pronation    Wrist supination    Grip strength (lbs)    (Blank rows = not tested)   JOINT MOBILITY TESTING:  deferred  PALPATION:  Tender at anterior and superior shoulder                                                                                                                             TREATMENT DATE: 07/12/23 Initial eval completed and initiated HEP Using models, educated patient on anatomy of the shoulder, what impingement is and how we will proceed with her rehab, also went over precautions and contraindications along  with pain control    PATIENT EDUCATION: Education details: Using models, educated patient on anatomy of the shoulder, what impingement is and how we will proceed with her rehab, also went over precautions and contraindications along with pain control Person educated: Patient Education method: Explanation, Demonstration, Verbal cues, and Handouts Education comprehension: verbalized understanding, returned demonstration, and verbal cues required  HOME EXERCISE PROGRAM: Access Code: [email or web address] URL:  [email or web address] Date: 07/12/2023 Prepared by: Aletha Anderson  Exercises - Supine Shoulder Flexion Extension AAROM with Dowel  - 1 x daily - 7 x weekly - 1 sets - 10 reps - Supine Shoulder Abduction AAROM with Dowel  - 1 x daily - 7 x weekly - 1 sets - 10 reps - Supine Shoulder External Rotation in 45 Degrees Abduction AAROM with Dowel  - 1 x daily - 7 x weekly - 1 sets - 10 reps - Standing Shoulder Internal Rotation Stretch with Towel  - 1 x daily - 7 x weekly - 1 sets - 10 reps - Circular Shoulder Pendulum with Table Support  - 1 x daily - 7 x weekly - 3 sets - 10 reps  ASSESSMENT:  CLINICAL IMPRESSION: Patient is a 53 y.o. female who was seen today for physical therapy evaluation and treatment for post op left shoulder scope.  Surgeon initially suspected complete tear and planned on repair but during surgery, found that patient only needed debridement and SAD.  She presents with decreased ROM, strength and function along with elevated pain.  She would respond well to skilled PT for post shoulder scope protocol.  We initiated AAROM along with pendulums for HEP and instructed in use of ice for pain control.      OBJECTIVE IMPAIRMENTS: decreased ROM, decreased strength, increased fascial restrictions, increased muscle spasms, impaired UE functional use, postural dysfunction, and pain.   ACTIVITY LIMITATIONS: carrying, lifting, sleeping, transfers, bed mobility, bathing, dressing, reach over head, hygiene/grooming, and caring for others  PARTICIPATION LIMITATIONS: meal prep, cleaning, laundry, driving, shopping, community activity, occupation, yard work, and church  PERSONAL FACTORS: 1 comorbidity: htn are also affecting patient's functional outcome.   REHAB POTENTIAL: Good  CLINICAL DECISION MAKING: Stable/uncomplicated  EVALUATION COMPLEXITY: Low  GOALS: Goals reviewed with patient? Yes  SHORT TERM GOALS: Target date: 08/09/2023  Pain report to be no greater  than 4/10  Baseline: Goal status: INITIAL  2.  Patient will be independent with initial HEP  Baseline:  Goal status: INITIAL  LONG TERM GOALS: Target date: 09/06/2023  Patient to report pain no greater than 2/10  Baseline:  Goal status: INITIAL  2.  Patient to be independent with advanced HEP  Baseline:  Goal status: INITIAL  3.  Patient will be able to reach overhead into cabinets and on top of shelves without pain  Baseline:  Goal status: INITIAL  4.  Patient to be able to sleep through the night  Baseline:  Goal status: INITIAL  5.  Quick dash score to improve to 35% or better Baseline:  Goal status: INITIAL  6.  Patient to be able to return to work Baseline:  Goal status: INITIAL  PLAN: PT FREQUENCY: 2x/week  PT DURATION: 8 weeks  PLANNED INTERVENTIONS: 97110-Therapeutic exercises, 97530- Therapeutic activity, [id number]- Neuromuscular re-education, 97535- Self Care, [id number]- Manual therapy, [id number]- Aquatic Therapy, 97760- Splinting, [id number]- Electrical stimulation (unattended), [id number]- Electrical stimulation (manual), [id number]- Vasopneumatic device, [id number]- Ultrasound, [id number]- Ionotophoresis 4mg /ml Dexamethasone, Taping, Joint mobilization, Scar mobilization, Cryotherapy, and Moist heat  PLAN FOR NEXT SESSION: Review HEP, PROM,  wrist, hand and forearm strengthening.     [facility]get[facility] Campus B. Lauran Romanski, PT 07/13/23 7:31 AM [facility] [address] [place], [place] [address] Phone # [phone] Fax [phone]

## 2023-07-17 ENCOUNTER — Other Ambulatory Visit (HOSPITAL_BASED_OUTPATIENT_CLINIC_OR_DEPARTMENT_OTHER): Payer: Self-pay

## 2023-07-17 ENCOUNTER — Other Ambulatory Visit (HOSPITAL_BASED_OUTPATIENT_CLINIC_OR_DEPARTMENT_OTHER): Payer: Self-pay | Admitting: Orthopaedic Surgery

## 2023-07-17 MED ORDER — OXYCODONE HCL 5 MG PO TABS
5.0000 mg | ORAL_TABLET | ORAL | 0 refills | Status: DC | PRN
  Filled 2023-07-17: qty 20, 4d supply, fill #0

## 2023-07-17 MED ORDER — TRAMADOL HCL 50 MG PO TABS
50.0000 mg | ORAL_TABLET | Freq: Four times a day (QID) | ORAL | 0 refills | Status: AC | PRN
Start: 2023-07-17 — End: ?
  Filled 2023-07-17: qty 15, 4d supply, fill #0

## 2023-08-02 ENCOUNTER — Ambulatory Visit

## 2023-08-02 DIAGNOSIS — M542 Cervicalgia: Secondary | ICD-10-CM

## 2023-08-02 DIAGNOSIS — M25612 Stiffness of left shoulder, not elsewhere classified: Secondary | ICD-10-CM

## 2023-08-02 DIAGNOSIS — M75122 Complete rotator cuff tear or rupture of left shoulder, not specified as traumatic: Secondary | ICD-10-CM | POA: Diagnosis not present

## 2023-08-02 DIAGNOSIS — R252 Cramp and spasm: Secondary | ICD-10-CM

## 2023-08-02 DIAGNOSIS — M6281 Muscle weakness (generalized): Secondary | ICD-10-CM

## 2023-08-02 DIAGNOSIS — M25512 Pain in left shoulder: Secondary | ICD-10-CM

## 2023-08-02 NOTE — Therapy (Signed)
 OUTPATIENT PHYSICAL THERAPY UPPER EXTREMITY EVALUATION   Patient Name: Angel Kemp MRN: [id number] DOB:1970-11-11, 53 y.o., female Today's Date: 08/02/2023  END OF SESSION:  PT End of Session - 08/02/23 1612     Visit Number 2    Date for PT Re-Evaluation 09/06/23    Authorization Type [employer]    Progress Note Due on Visit 10    PT Start Time 1612    PT Stop Time 1657    PT Time Calculation (min) 45 min    Activity Tolerance Patient tolerated treatment well    Behavior During Therapy [facility] for tasks assessed/performed          Past Medical History:  Diagnosis Date   Allergy    Arthritis    Cough with hemoptysis 09/21/2011   Goiter    History of urinary urgency    Hypertension    Hyperthyroidism    Mass in chest 09/21/2011   Past Surgical History:  Procedure Laterality Date   TUBAL LIGATION     VIDEO BRONCHOSCOPY  09/23/2011   Procedure: VIDEO BRONCHOSCOPY WITHOUT FLUORO;  Surgeon: Florian Hint, MD;  Location: [facility] ENDOSCOPY;  Service: Cardiopulmonary;  Laterality: Bilateral;   WISDOM TOOTH EXTRACTION     Patient Active Problem List   Diagnosis Date Noted   Tinnitus of both ears 05/05/2023   Seasonal allergic rhinitis due to pollen 05/05/2023   Adjustment disorder with mixed anxiety and depressed mood 05/02/2023   Bereavement 04/12/2023   Mass of cervix 02/15/2023   Bilateral shoulder region arthritis 11/18/2022   Hidradenitis axillaris 06/24/2020   Chronic midline low back pain without sciatica 04/12/2017   Multiple thyroid  nodules 10/27/2016   Graves disease 10/27/2016   Urge incontinence 06/02/2016   Chest pain 07/23/2012   Hemoptysis 09/21/2011   Mediastinal mass 09/21/2011    PCP: Dr. Elia Simpler  REFERRING PROVIDER: Dr. Genelle  REFERRING DIAG: [phone] (ICD-10-CM) - Complete rotator cuff tear or rupture of left shoulder, not specified as traumatic  THERAPY DIAG:  Acute pain of left shoulder  Stiffness of left shoulder, not elsewhere  classified  Muscle weakness (generalized)  Cramp and spasm  Cervicalgia  Rationale for Evaluation and Treatment: Rehabilitation  ONSET DATE: 05/11/2023  SUBJECTIVE:                                                                                                                                                                                      SUBJECTIVE STATEMENT: Patient reports she is doing ok.  Just feels pinchy Hand dominance: Right  PERTINENT HISTORY: Per patient, surgeon initially suspected she would need full repair but during surgery, found that this was not the  case and ended up having just debridement and SAD.    PAIN:  08/02/23 Are you having pain? Yes: NPRS scale: 4-5/10  Pain location: left shoulder Pain description: sharp if I try to reach up, aching  Aggravating factors: reaching , dressing , bathing Relieving factors: ice, tylenol  and pain medication  PRECAUTIONS: None  RED FLAGS: None   WEIGHT BEARING RESTRICTIONS: No  FALLS:  Has patient fallen in last 6 months? 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  DIAGNOSTIC FINDINGS:  na  PATIENT SURVEYS :  Quick Dash 70.5/100= 70.5%  COGNITION: Overall cognitive status: Within functional limits for tasks assessed     SENSATION: WFL  POSTURE: Slightly rounded shoulder   UPPER EXTREMITY ROM:   Passive ROM Right eval Left eval  Shoulder flexion  100   Shoulder extension    Shoulder abduction  80  Shoulder adduction    Shoulder internal rotation  60  Shoulder external rotation  8  Elbow flexion    Elbow extension    Wrist flexion    Wrist extension    Wrist ulnar deviation    Wrist radial deviation    Wrist pronation    Wrist supination    (Blank rows = not tested)  UPPER EXTREMITY MMT:  MMT Right eval Left eval  Shoulder flexion 4 3  Shoulder extension    Shoulder abduction 4 3  Shoulder adduction    Shoulder internal rotation 4+ 3  Shoulder external rotation 4 3  Middle trapezius    Lower trapezius    Elbow flexion    Elbow extension    Wrist flexion    Wrist extension    Wrist ulnar deviation    Wrist radial deviation    Wrist pronation    Wrist supination    Grip strength (lbs)    (Blank rows = not tested)   JOINT MOBILITY TESTING:  deferred  PALPATION:  Tender at anterior and superior shoulder                                                                                                                             TREATMENT DATE: 08/02/23 Pendulums: cw, ccw, fwd/back, side/side x 20 each Reviewed HEP of AAROM using cane x 10-15 each PROM left shoulder : all planes of motion x 10 each holding 10 sec each Added isometric shoulder flex,ext, abd, IR and ER x 5 each holding 5 sec each (added to HEP) Ice to left shoulder x 10  min at end of session in hook lying with knees propped on bolster  TREATMENT DATE: 07/12/23 Initial eval completed and initiated HEP Using models, educated patient on anatomy of the shoulder, what impingement is and how we will proceed with her rehab, also went over precautions and contraindications along with pain control      PATIENT EDUCATION: Education details: Using models, educated patient on anatomy of the shoulder, what impingement is and how we will proceed with her rehab, also went over precautions and contraindications along with pain control Person educated:  Patient Education method: Explanation, Demonstration, Verbal cues, and Handouts Education comprehension: verbalized understanding, returned demonstration, and verbal cues required  HOME EXERCISE PROGRAM: Access Code: [email or web address] URL: [email or web address] Date: 08/02/2023 Prepared by: Delon Haddock  Exercises - Supine Shoulder Flexion Extension AAROM with Dowel  - 1 x daily - 7 x weekly - 1 sets - 10 reps - Supine Shoulder Abduction AAROM with Dowel  - 1 x daily - 7 x weekly - 1 sets - 10 reps - Supine Shoulder External Rotation in 45 Degrees Abduction AAROM with Dowel  - 1 x daily - 7 x weekly - 1 sets - 10 reps - Standing Shoulder Internal Rotation Stretch with Towel  - 1 x daily - 7 x weekly - 1 sets - 10 reps - Circular Shoulder Pendulum with Table Support  - 1 x daily - 7 x weekly - 3 sets - 10 reps - Standing Isometric Shoulder Flexion with Doorway - Arm Bent  - 1 x daily - 7 x weekly - 1 sets - 5 reps - 5 sec hold - Standing Isometric Shoulder Extension with Doorway - Arm Bent  - 1 x daily - 7 x weekly - 1 sets - 5 reps - 5 sec hold - Standing Isometric Shoulder Abduction with Doorway - Arm Bent  - 1 x daily - 7 x weekly - 1 sets - 5 reps - 5 sec hold - Standing Isometric Shoulder External Rotation with Doorway and Towel Roll  - 1 x daily - 7 x weekly - 1 sets - 5 reps - 5 sec hold - Standing Isometric Shoulder Internal Rotation with Towel Roll at Doorway  - 1 x daily - 7 x weekly - 1 sets - 5 reps - 5 sec hold  ASSESSMENT:  CLINICAL IMPRESSION: Cearra is progressing appropriately.  She is gaining ROM as expected but is struggling with IR.  However, during PROM, IR seems to be progressing well.  She has not been able to get in for appts due to difficulty with transportation and using the wait list.   PT suggested she check with front desk or call to see if anything has opened up that she can schedule.  She is well motivated and compliant.  She should continue to do  well.   OBJECTIVE IMPAIRMENTS: decreased ROM, decreased strength, increased fascial restrictions, increased muscle spasms, impaired UE functional use, postural dysfunction, and pain.   ACTIVITY LIMITATIONS: carrying, lifting, sleeping, transfers, bed mobility, bathing, dressing, reach over head, hygiene/grooming, and caring for others  PARTICIPATION LIMITATIONS: meal prep, cleaning, laundry, driving, shopping, community activity, occupation, yard work, and church  PERSONAL FACTORS: 1 comorbidity: htn are also affecting patient's functional outcome.   REHAB POTENTIAL: Good  CLINICAL DECISION MAKING: Stable/uncomplicated  EVALUATION COMPLEXITY: Low  GOALS: Goals reviewed with patient? Yes  SHORT TERM GOALS: Target date: 08/09/2023  Pain report to be no greater than 4/10  Baseline: Goal status: IN PROGRESS 08/02/23  2.  Patient will be independent with initial HEP  Baseline:  Goal status: INITIAL  LONG TERM GOALS: Target date: 09/06/2023  Patient to report pain no greater than 2/10  Baseline:  Goal status: INITIAL  2.  Patient to be independent with advanced HEP  Baseline:  Goal status: INITIAL  3.  Patient will be able to reach overhead into cabinets and on top of shelves without pain  Baseline:  Goal status: INITIAL  4.  Patient to be able to sleep through the night  Baseline:  Goal status: INITIAL  5.  Quick dash score to improve to 35% or better Baseline:  Goal status: INITIAL  6.  Patient to be able to return to work Baseline:  Goal status: INITIAL  PLAN: PT FREQUENCY: 2x/week  PT DURATION: 8 weeks  PLANNED INTERVENTIONS: 97110-Therapeutic exercises, 97530- Therapeutic activity, [id number]- Neuromuscular re-education, 97535- Self Care, [id number]- Manual therapy, [id number]- Aquatic Therapy, 97760- Splinting, [id number]- Electrical stimulation (unattended), [id number]- Electrical stimulation (manual), [id number]- Vasopneumatic device, [id number]- Ultrasound, [id number]- Ionotophoresis 4mg /ml  Dexamethasone, Taping, Joint mobilization, Scar mobilization, Cryotherapy, and Moist heat  PLAN FOR NEXT SESSION: Review added isometrics to HEP, PROM, wrist, hand and forearm strengthening.     Delon B. Jalilah Wiltsie, PT 08/02/23 4:59 PM [facility] [address], [address] [place], [place] [address] Phone # [phone] Fax [phone]

## 2023-08-03 ENCOUNTER — Ambulatory Visit: Admitting: Physical Therapy

## 2023-08-03 ENCOUNTER — Encounter: Payer: Self-pay | Admitting: Physical Therapy

## 2023-08-03 DIAGNOSIS — M75122 Complete rotator cuff tear or rupture of left shoulder, not specified as traumatic: Secondary | ICD-10-CM | POA: Diagnosis not present

## 2023-08-03 DIAGNOSIS — M25512 Pain in left shoulder: Secondary | ICD-10-CM

## 2023-08-03 DIAGNOSIS — M25612 Stiffness of left shoulder, not elsewhere classified: Secondary | ICD-10-CM

## 2023-08-03 DIAGNOSIS — M6281 Muscle weakness (generalized): Secondary | ICD-10-CM

## 2023-08-03 NOTE — Therapy (Signed)
 OUTPATIENT PHYSICAL THERAPY UPPER EXTREMITY EVALUATION   Patient Name: Angel Kemp MRN: [id number] DOB:[birth year], 53 y.o., female Today's Date: 08/03/2023  END OF SESSION:  PT End of Session - 08/03/23 1615     Visit Number 3    Date for PT Re-Evaluation 09/06/23    Authorization Type [employer]    Progress Note Due on Visit 10    PT Start Time 1615    PT Stop Time 1655    PT Time Calculation (min) 40 min    Activity Tolerance Patient tolerated treatment well    Behavior During Therapy [facility] for tasks assessed/performed           Past Medical History:  Diagnosis Date   Allergy    Arthritis    Cough with hemoptysis 09/21/2011   Goiter    History of urinary urgency    Hypertension    Hyperthyroidism    Mass in chest 09/21/2011   Past Surgical History:  Procedure Laterality Date   TUBAL LIGATION     VIDEO BRONCHOSCOPY  09/23/2011   Procedure: VIDEO BRONCHOSCOPY WITHOUT FLUORO;  Surgeon: Florian Hint, MD;  Location: [facility] ENDOSCOPY;  Service: Cardiopulmonary;  Laterality: Bilateral;   WISDOM TOOTH EXTRACTION     Patient Active Problem List   Diagnosis Date Noted   Tinnitus of both ears 05/05/2023   Seasonal allergic rhinitis due to pollen 05/05/2023   Adjustment disorder with mixed anxiety and depressed mood 05/02/2023   Bereavement 04/12/2023   Mass of cervix 02/15/2023   Bilateral shoulder region arthritis 11/18/2022   Hidradenitis axillaris 06/24/2020   Chronic midline low back pain without sciatica 04/12/2017   Multiple thyroid  nodules 10/27/2016   Graves disease 10/27/2016   Urge incontinence 06/02/2016   Chest pain 07/23/2012   Hemoptysis 09/21/2011   Mediastinal mass 09/21/2011    PCP: Dr. Elia Simpler  REFERRING PROVIDER: Dr. Genelle  REFERRING DIAG: [phone] (ICD-10-CM) - Complete rotator cuff tear or rupture of left shoulder, not specified as traumatic  THERAPY DIAG:  Acute pain of left shoulder  Stiffness of left shoulder, not  elsewhere classified  Muscle weakness (generalized)  Rationale for Evaluation and Treatment: Rehabilitation  ONSET DATE: 05/11/2023  SUBJECTIVE:                                                                                                                                                                                      SUBJECTIVE STATEMENT: I had a lot of pain after yesterday's session - it was hard to sleep.  Very achy.  I used ice and pain meds but nothing really helped.  I expect that though and want to work though it.  Hand dominance: Right  PERTINENT HISTORY: Per patient, surgeon initially suspected she would need full repair but during surgery, found that this was not the case and ended up having just debridement and SAD.    PAIN:  08/02/23 Are you having pain? Yes: NPRS scale: 7/10  Pain location: left shoulder Pain description: sharp if I try to reach up, aching  Aggravating factors: reaching , dressing , bathing Relieving factors: ice, tylenol  and pain medication  PRECAUTIONS: None  RED FLAGS: None   WEIGHT BEARING RESTRICTIONS: No  FALLS:  Has patient fallen in last 6 months? 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  DIAGNOSTIC FINDINGS:  na  PATIENT SURVEYS :  Quick Dash 70.5/100= 70.5%  COGNITION: Overall cognitive status: Within functional limits for tasks assessed     SENSATION: WFL  POSTURE: Slightly  rounded shoulder   UPPER EXTREMITY ROM:   Passive ROM Right eval Left eval  Shoulder flexion  100  Shoulder extension    Shoulder abduction  80  Shoulder adduction    Shoulder internal rotation  60  Shoulder external rotation  8  Elbow flexion    Elbow extension    Wrist flexion    Wrist extension    Wrist ulnar deviation    Wrist radial deviation    Wrist pronation    Wrist supination    (Blank rows = not tested)  UPPER EXTREMITY MMT:  MMT Right eval Left eval  Shoulder flexion 4 3  Shoulder extension    Shoulder abduction 4 3  Shoulder adduction    Shoulder internal rotation 4+ 3  Shoulder external rotation 4 3  Middle trapezius    Lower trapezius    Elbow flexion    Elbow extension    Wrist flexion    Wrist extension    Wrist ulnar deviation    Wrist radial deviation    Wrist pronation    Wrist supination    Grip strength (lbs)    (Blank rows = not tested)   JOINT MOBILITY TESTING:  deferred  PALPATION:  Tender at anterior and superior shoulder     TREATMENT DATE:   08/03/23  Lt shoulder Supine P/ROM and AA/ROM: flexion, IR/ER, scaption Lt shoulder Isometric holds at 80, 90, 100 deg flexion x10 each Lt Arm at side supine with pressure by PT: IR/ER isometrics 5x5 each Lt hand digiflex 5lb x1' Supine bicep curls bil 2lb (tried 3lb but too hard) x12 Supine dowel chest press at 80 deg and 90 deg x10 each Pulleys flexion x2' Finger ladder x 3 rounds - reached 32 Seated ranger x1' with hip hinge reach  TREATMENT DATE: 08/02/23 Pendulums: cw, ccw, fwd/back, side/side x 20 each Reviewed HEP of AAROM using cane x 10-15 each PROM left shoulder : all planes of motion x 10 each holding 10 sec each Added isometric shoulder flex,ext, abd, IR and ER x 5 each holding 5 sec each (added to HEP) Ice to left shoulder x 10 min at end of session in  hook lying with knees propped on bolster  TREATMENT DATE: 07/12/23 Initial eval completed and initiated HEP Using models, educated patient on anatomy of the shoulder, what impingement is and how we will proceed with her rehab, also went over precautions and contraindications along with pain control      PATIENT EDUCATION: Education details: Using models, educated patient on anatomy of the shoulder, what impingement is and how we will proceed with her rehab, also went over precautions and contraindications along with pain control Person educated: Patient Education method: Explanation, Demonstration, Verbal cues, and Handouts Education comprehension: verbalized understanding, returned demonstration, and verbal cues required  HOME EXERCISE PROGRAM: Access Code: [email or web address] URL: [email or web address] Date: 08/02/2023 Prepared by: Delon Haddock  Exercises - Supine Shoulder Flexion Extension AAROM with Dowel  - 1 x daily - 7 x weekly - 1 sets - 10 reps - Supine Shoulder Abduction AAROM with Dowel  - 1 x daily - 7 x weekly - 1 sets - 10 reps - Supine Shoulder External Rotation in 45 Degrees Abduction AAROM with Dowel  - 1 x daily - 7 x weekly - 1 sets - 10 reps - Standing Shoulder Internal Rotation Stretch with Towel  - 1 x daily - 7 x weekly - 1 sets - 10 reps - Circular Shoulder Pendulum with Table Support  - 1 x daily - 7 x weekly - 3 sets - 10 reps - Standing Isometric Shoulder Flexion with Doorway - Arm Bent  - 1 x daily - 7 x weekly - 1 sets - 5 reps - 5 sec hold - Standing Isometric Shoulder Extension with Doorway - Arm Bent  - 1 x daily - 7 x weekly - 1 sets - 5 reps - 5 sec hold - Standing Isometric Shoulder Abduction with Doorway - Arm Bent  - 1 x daily - 7 x weekly - 1 sets - 5 reps - 5 sec hold - Standing Isometric Shoulder External Rotation with Doorway and Towel Roll  - 1 x daily - 7 x weekly - 1 sets - 5 reps - 5 sec hold - Standing Isometric Shoulder Internal  Rotation with Towel Roll at Doorway  - 1 x daily - 7 x weekly - 1 sets - 5 reps - 5 sec hold  ASSESSMENT:  CLINICAL IMPRESSION: Aalia was very sore after first follow up visit which is to be expected as we work on ranging her post-op shoulder.  She was able to tolerate P/ROM, AA/ROM, isometric holds at various degress of flexion and with neutral GH joint for IR/ER as well as elbow and wrist strengthening today.  She reached 32 on finger ladder and felt the seated ranger mitigated her pain end of session.  She declined ice end of session secondary to feeling much less pain.   OBJECTIVE IMPAIRMENTS: decreased ROM, decreased strength, increased fascial restrictions, increased muscle spasms, impaired UE functional use, postural dysfunction, and pain.   ACTIVITY LIMITATIONS: carrying, lifting, sleeping, transfers, bed mobility, bathing, dressing, reach over head, hygiene/grooming, and caring for others  PARTICIPATION LIMITATIONS: meal prep, cleaning, laundry, driving, shopping, community activity, occupation, yard work, and  church  PERSONAL FACTORS: 1 comorbidity: htn are also affecting patient's functional outcome.   REHAB POTENTIAL: Good  CLINICAL DECISION MAKING: Stable/uncomplicated  EVALUATION COMPLEXITY: Low  GOALS: Goals reviewed with patient? Yes  SHORT TERM GOALS: Target date: 08/09/2023  Pain report to be no greater than 4/10  Baseline: Goal status: IN PROGRESS 08/02/23  2.  Patient will be independent with initial HEP  Baseline:  Goal status: INITIAL  LONG TERM GOALS: Target date: 09/06/2023  Patient to report pain no greater than 2/10  Baseline:  Goal status: INITIAL  2.  Patient to be independent with advanced HEP  Baseline:  Goal status: INITIAL  3.  Patient will be able to reach overhead into cabinets and on top of shelves without pain  Baseline:  Goal status: INITIAL  4.  Patient to be able to sleep through the night  Baseline:  Goal status: INITIAL  5.   Quick dash score to improve to 35% or better Baseline:  Goal status: INITIAL  6.  Patient to be able to return to work Baseline:  Goal status: INITIAL  PLAN: PT FREQUENCY: 2x/week  PT DURATION: 8 weeks  PLANNED INTERVENTIONS: 97110-Therapeutic exercises, 97530- Therapeutic activity, [id number]- Neuromuscular re-education, 97535- Self Care, [id number]- Manual therapy, [id number]- Aquatic Therapy, 97760- Splinting, [id number]- Electrical stimulation (unattended), [id number]- Electrical stimulation (manual), [id number]- Vasopneumatic device, [id number]- Ultrasound, [id number]- Ionotophoresis 4mg /ml Dexamethasone, Taping, Joint mobilization, Scar mobilization, Cryotherapy, and Moist heat  PLAN FOR NEXT SESSION: continue seated ranger, pulleys, finger ladder to progress AA/ROM, continue isometrics, PROM, wrist, hand and forearm strengthening.     Orvil Fester, PT 08/03/23 5:02 PM  [facility] [address], [address] [place], [place] [address] Phone # [phone] Fax [phone]

## 2023-08-07 ENCOUNTER — Ambulatory Visit (INDEPENDENT_AMBULATORY_CARE_PROVIDER_SITE_OTHER): Admitting: Orthopaedic Surgery

## 2023-08-07 ENCOUNTER — Other Ambulatory Visit (HOSPITAL_BASED_OUTPATIENT_CLINIC_OR_DEPARTMENT_OTHER): Payer: Self-pay

## 2023-08-07 DIAGNOSIS — M75122 Complete rotator cuff tear or rupture of left shoulder, not specified as traumatic: Secondary | ICD-10-CM

## 2023-08-07 NOTE — Progress Notes (Signed)
 Post Operative Evaluation    Procedure/Date of Surgery: Left shoulder debridement 5/15  Interval History:    Presents 6 weeks status post the above procedure.  She has been having a hard time getting in with physical therapy and as result is having some difficulty with overhead range of motion.   PMH/PSH/Family History/Social History/Meds/Allergies:    Past Medical History:  Diagnosis Date   Allergy    Arthritis    Cough with hemoptysis 09/21/2011   Goiter    History of urinary urgency    Hypertension    Hyperthyroidism    Mass in chest 09/21/2011   Past Surgical History:  Procedure Laterality Date   TUBAL LIGATION     VIDEO BRONCHOSCOPY  09/23/2011   Procedure: VIDEO BRONCHOSCOPY WITHOUT FLUORO;  Surgeon: Florian Hint, MD;  Location: [facility] ENDOSCOPY;  Service: Cardiopulmonary;  Laterality: Bilateral;   WISDOM TOOTH EXTRACTION     Social History   Socioeconomic History   Marital status: Single    Spouse name: Not on file   Number of children: Not on file   Years of education: Not on file   Highest education level: Associate degree: occupational, Scientist, product/process development, or vocational program  Occupational History   Not on file  Tobacco Use   Smoking status: Former    Current packs/day: 0.00    Types: Cigarettes    Quit date: 07/07/2011    Years since quitting: 12.0   Smokeless tobacco: Never  Vaping Use   Vaping status: Never Used  Substance and Sexual Activity   Alcohol use: No   Drug use: No   Sexual activity: Not Currently    Birth control/protection: Post-menopausal, Surgical  Other Topics Concern   Not on file  Social History Narrative   Not on file   Social Drivers of Health   Financial Resource Strain: Medium Risk (02/15/2023)   Overall Financial Resource Strain (CARDIA)    Difficulty of Paying Living Expenses: Somewhat hard  Food Insecurity: Food Insecurity Present (02/15/2023)   Hunger Vital Sign    Worried About  Running Out of Food in the Last Year: Sometimes true    Ran Out of Food in the Last Year: Sometimes true  Transportation Needs: No Transportation Needs (02/15/2023)   PRAPARE - Administrator, Civil Service (Medical): No    Lack of Transportation (Non-Medical): No  Physical Activity: Inactive (02/15/2023)   Exercise Vital Sign    Days of Exercise per Week: 0 days    Minutes of Exercise per Session: 10 min  Stress: Stress Concern Present (02/15/2023)   [employer] of Occupational Health - Occupational Stress Questionnaire    Feeling of Stress : Very much  Social Connections: Unknown (02/15/2023)   Social Connection and Isolation Panel    Frequency of Communication with Friends and Family: Patient declined    Frequency of Social Gatherings with Friends and Family: Patient declined    Attends Religious Services: Never    Database administrator or Organizations: No    Attends Engineer, structural: Not on file    Marital Status: Never married  Recent Concern: Social Connections - Socially Isolated (12/05/2022)   Social Connection and Isolation Panel    Frequency of Communication with Friends and Family: More than three times a week    Frequency of  Social Gatherings with Friends and Family: More than three times a week    Attends Religious Services: Never    Database administrator or Organizations: No    Attends Engineer, structural: Not on file    Marital Status: Never married   Family History  Problem Relation Age of Onset   Cancer Mother        Lung   Hypertension Mother    Diabetes type II Mother    Colon cancer Neg Hx    Rectal cancer Neg Hx    Stomach cancer Neg Hx    Esophageal cancer Neg Hx    Allergies  Allergen Reactions   Latex Itching and Rash   Current Outpatient Medications  Medication Sig Dispense Refill   acetaminophen  (TYLENOL ) 325 MG tablet Take 650 mg by mouth every 6 (six) hours as needed.     aspirin  EC 325 MG tablet Take 1  tablet (325 mg total) by mouth daily. 30 tablet 0   fluticasone  (FLONASE ) 50 MCG/ACT nasal spray Place 2 sprays into both nostrils daily. 16 g 3   oxyCODONE  (ROXICODONE ) 5 MG immediate release tablet Take 1 tablet (5 mg total) by mouth every 4 (four) hours as needed for severe pain (pain score 7-10) or breakthrough pain. 20 tablet 0   traMADol  (ULTRAM ) 50 MG tablet Take 1 tablet (50 mg total) by mouth every 6 (six) hours as needed. 15 tablet 0   zolpidem  (AMBIEN ) 5 MG tablet Take 1 tablet (5 mg total) by mouth at bedtime as needed for sleep. 15 tablet 1   No current facility-administered medications for this visit.   No results found.  Review of Systems:   A ROS was performed including pertinent positives and negatives as documented in the HPI.   Musculoskeletal Exam:    There were no vitals taken for this visit.  Left shoulder incisions are well-appearing without erythema or drainage.  In the spine position she can forward elevate to 90 degrees external rotation at 45 degrees.  Distal neurosensory exam is intact  Imaging:      I personally reviewed and interpreted the radiographs.   Assessment:   6-week status post left shoulder arthroscopic debridement overall doing well.  At this time I would like her to continue to work with physical therapy for active overhead range of motion.  I did show her specific exercises she can work on to help with this.  I will plan to see her back in 4 weeks for reassessment.  I did discuss that she may ultimately need an injection at the next visit if her range of motion is not improved Plan :    - Return to clinic in 4 weeks for reassessment      I personally saw and evaluated the patient, and participated in the management and treatment plan.  Elspeth Parker, MD Attending Physician, Orthopedic Surgery  This document was dictated using Dragon voice recognition software. A reasonable attempt at proof reading has been made to minimize  errors.

## 2023-08-14 ENCOUNTER — Encounter (HOSPITAL_BASED_OUTPATIENT_CLINIC_OR_DEPARTMENT_OTHER): Payer: Self-pay

## 2023-08-16 ENCOUNTER — Ambulatory Visit: Attending: Orthopaedic Surgery | Admitting: Physical Therapy

## 2023-08-16 ENCOUNTER — Encounter: Payer: Self-pay | Admitting: Physical Therapy

## 2023-08-16 DIAGNOSIS — R252 Cramp and spasm: Secondary | ICD-10-CM | POA: Insufficient documentation

## 2023-08-16 DIAGNOSIS — M6281 Muscle weakness (generalized): Secondary | ICD-10-CM | POA: Insufficient documentation

## 2023-08-16 DIAGNOSIS — M25512 Pain in left shoulder: Secondary | ICD-10-CM | POA: Insufficient documentation

## 2023-08-16 DIAGNOSIS — M25612 Stiffness of left shoulder, not elsewhere classified: Secondary | ICD-10-CM | POA: Diagnosis present

## 2023-08-16 NOTE — Therapy (Signed)
 OUTPATIENT PHYSICAL THERAPY UPPER EXTREMITY EVALUATION   Patient Name: Angel Kemp MRN: [id number] DOB:1970/11/16, 53 y.o., female Today's Date: 08/16/2023  END OF SESSION:  PT End of Session - 08/16/23 1619     Visit Number 4    Date for PT Re-Evaluation 09/06/23    Authorization Type [employer]    Progress Note Due on Visit 10    PT Start Time 1616    PT Stop Time 1703    PT Time Calculation (min) 47 min    Activity Tolerance Patient tolerated treatment well    Behavior During Therapy [facility] for tasks assessed/performed           Past Medical History:  Diagnosis Date   Allergy    Arthritis    Cough with hemoptysis 09/21/2011   Goiter    History of urinary urgency    Hypertension    Hyperthyroidism    Mass in chest 09/21/2011   Past Surgical History:  Procedure Laterality Date   TUBAL LIGATION     VIDEO BRONCHOSCOPY  09/23/2011   Procedure: VIDEO BRONCHOSCOPY WITHOUT FLUORO;  Surgeon: Florian Hint, MD;  Location: [facility] ENDOSCOPY;  Service: Cardiopulmonary;  Laterality: Bilateral;   WISDOM TOOTH EXTRACTION     Patient Active Problem List   Diagnosis Date Noted   Tinnitus of both ears 05/05/2023   Seasonal allergic rhinitis due to pollen 05/05/2023   Adjustment disorder with mixed anxiety and depressed mood 05/02/2023   Bereavement 04/12/2023   Mass of cervix 02/15/2023   Bilateral shoulder region arthritis 11/18/2022   Hidradenitis axillaris 06/24/2020   Chronic midline low back pain without sciatica 04/12/2017   Multiple thyroid  nodules 10/27/2016   Graves disease 10/27/2016   Urge incontinence 06/02/2016   Chest pain 07/23/2012   Hemoptysis 09/21/2011   Mediastinal mass 09/21/2011    PCP: Dr. Elia Simpler  REFERRING PROVIDER: Dr. Genelle  REFERRING DIAG: [phone] (ICD-10-CM) - Complete rotator cuff tear or rupture of left shoulder, not specified as traumatic  THERAPY DIAG:  Acute pain of left shoulder  Stiffness of left shoulder, not elsewhere  classified  Muscle weakness (generalized)  Cramp and spasm  Rationale for Evaluation and Treatment: Rehabilitation  ONSET DATE: 05/11/2023  SUBJECTIVE:                                                                                                                                                                                      SUBJECTIVE STATEMENT: I saw the MD.  He wants me to keep working on overhead movement.  I am stress with packing/moving and haven't been able to focus on my HEP as much as I shoulder. Hand dominance:  Right  PERTINENT HISTORY: Per patient, surgeon initially suspected she would need full repair but during surgery, found that this was not the case and ended up having just debridement and SAD.    PAIN:  08/02/23 Are you having pain? Yes: NPRS scale: 9-10/10  Pain location: left shoulder Pain description: sharp if I try to reach up, aching  Aggravating factors: reaching , dressing , bathing Relieving factors: ice, tylenol  and pain medication  PRECAUTIONS: None  RED FLAGS: None   WEIGHT BEARING RESTRICTIONS: No  FALLS:  Has patient fallen in last 6 months? 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  DIAGNOSTIC FINDINGS:  na  PATIENT SURVEYS :  Quick Dash 70.5/100= 70.5%  COGNITION: Overall cognitive status: Within functional limits for tasks assessed     SENSATION: WFL  POSTURE: Slightly rounded  shoulder   UPPER EXTREMITY ROM:   Passive ROM Right eval Left eval  Shoulder flexion  100  Shoulder extension    Shoulder abduction  80  Shoulder adduction    Shoulder internal rotation  60  Shoulder external rotation  8  Elbow flexion    Elbow extension    Wrist flexion    Wrist extension    Wrist ulnar deviation    Wrist radial deviation    Wrist pronation    Wrist supination    (Blank rows = not tested)  UPPER EXTREMITY MMT:  MMT Right eval Left eval  Shoulder flexion 4 3  Shoulder extension    Shoulder abduction 4 3  Shoulder adduction    Shoulder internal rotation 4+ 3  Shoulder external rotation 4 3  Middle trapezius    Lower trapezius    Elbow flexion    Elbow extension    Wrist flexion    Wrist extension    Wrist ulnar deviation    Wrist radial deviation    Wrist pronation    Wrist supination    Grip strength (lbs)    (Blank rows = not tested)   JOINT MOBILITY TESTING:  deferred  PALPATION:  Tender at anterior and superior shoulder     TREATMENT DATE:    08/16/23 Seated ranger at 90 deg x2' Lt shoulder SL Lt scapular PNF for retraction/depression, then AA/ROM for Lt UE flexion in Rt SL Supine dowel chest press and overhead flexion with PT providing inferior glide at Lt [facility] joint x10 Supine dowel scaption Lt with PT providing inferior glide at Lt [facility] joint x 10 Finger ladder with PT TC at scapula for depression with overhead position x5 Standing bicep curls bil 2lb x 20 Lt UE A/ROM flexion reach and tap shoulder height shelf x6, overhead shelf x6 with cues for focused scapular mechanics - much improved Standing ranger overhead range to 120 deg x10 Lt UE 2lb ankle weight rolled up, push up wall x 6 Standing A/ROM Lt shoulder - Lt flexion x3 reps - to 115 deg with improved mechanics  08/03/23  Lt shoulder Supine P/ROM and AA/ROM: flexion, IR/ER, scaption Lt shoulder Isometric holds  at 80, 90, 100 deg flexion x10 each Lt Arm at side supine with  pressure by PT: IR/ER isometrics 5x5 each Lt hand digiflex 5lb x1' Supine bicep curls bil 2lb (tried 3lb but too hard) x12 Supine dowel chest press at 80 deg and 90 deg x10 each Pulleys flexion x2' Finger ladder x 3 rounds - reached 32 Seated ranger x1' with hip hinge reach                                                                                                                      TREATMENT DATE: 08/02/23 Pendulums: cw, ccw, fwd/back, side/side x 20 each Reviewed HEP of AAROM using cane x 10-15 each PROM left shoulder : all planes of motion x 10 each holding 10 sec each Added isometric shoulder flex,ext, abd, IR and ER x 5 each holding 5 sec each (added to HEP) Ice to left shoulder x 10 min at end of session in hook lying with knees propped on bolster  TREATMENT DATE: 07/12/23 Initial eval completed and initiated HEP Using models, educated patient on anatomy of the shoulder, what impingement is and how we will proceed with her rehab, also went over precautions and contraindications along with pain control      PATIENT EDUCATION: Education details: Using models, educated patient on anatomy of the shoulder, what impingement is and how we will proceed with her rehab, also went over precautions and contraindications along with pain control Person educated: Patient Education method: Explanation, Demonstration, Verbal cues, and Handouts Education comprehension: verbalized understanding, returned demonstration, and verbal cues required  HOME EXERCISE PROGRAM: Access Code: [email or web address] URL: [email or web address] Date: 08/02/2023 Prepared by: Delon Haddock  Exercises - Supine Shoulder Flexion Extension AAROM with Dowel  - 1 x daily - 7 x weekly - 1 sets - 10 reps - Supine Shoulder Abduction AAROM with Dowel  - 1 x daily - 7 x weekly - 1 sets - 10 reps - Supine Shoulder External Rotation in 45 Degrees Abduction AAROM with Dowel  - 1 x daily - 7 x weekly - 1 sets - 10  reps - Standing Shoulder Internal Rotation Stretch with Towel  - 1 x daily - 7 x weekly - 1 sets - 10 reps - Circular Shoulder Pendulum with Table Support  - 1 x daily - 7 x weekly - 3 sets - 10 reps - Standing Isometric Shoulder Flexion with Doorway - Arm Bent  - 1 x daily - 7 x weekly - 1 sets - 5 reps - 5 sec hold - Standing Isometric Shoulder Extension with Doorway - Arm Bent  - 1 x daily - 7 x weekly - 1 sets - 5 reps - 5 sec hold - Standing Isometric Shoulder Abduction with Doorway - Arm Bent  - 1 x daily - 7 x weekly - 1 sets - 5 reps - 5 sec hold - Standing Isometric Shoulder External Rotation with Doorway and Towel Roll  - 1 x daily - 7 x weekly -  1 sets - 5 reps - 5 sec hold - Standing Isometric Shoulder Internal Rotation with Towel Roll at Doorway  - 1 x daily - 7 x weekly - 1 sets - 5 reps - 5 sec hold  ASSESSMENT:  CLINICAL IMPRESSION: Tniya saw the MD and he encouraged her to keep working on overhead flexion of Lt shoulder.  She has been busy and stress with packing to move and hasn't been able to focus on her HEP as much.  She displayed effort to flexion shoulder with signif shoulder hiking so we focused on PNF for scapular mechanics initially.  PT also provided inferior glide at [facility] joint with AA/ROM after PNF and Pt was able to perform ind without pain after this.  Encouraged Pt to work on reaching taps to [employer] in kitchen and sliding hand up wall with focus on scapular mechanics.  Pt will continue to benefit from skilled PT along POC.   OBJECTIVE IMPAIRMENTS: decreased ROM, decreased strength, increased fascial restrictions, increased muscle spasms, impaired UE functional use, postural dysfunction, and pain.   ACTIVITY LIMITATIONS: carrying, lifting, sleeping, transfers, bed mobility, bathing, dressing, reach over head, hygiene/grooming, and caring for others  PARTICIPATION LIMITATIONS: meal prep, cleaning, laundry, driving, shopping, community activity, occupation,  yard work, and church  PERSONAL FACTORS: 1 comorbidity: htn are also affecting patient's functional outcome.   REHAB POTENTIAL: Good  CLINICAL DECISION MAKING: Stable/uncomplicated  EVALUATION COMPLEXITY: Low  GOALS: Goals reviewed with patient? Yes  SHORT TERM GOALS: Target date: 08/09/2023  Pain report to be no greater than 4/10  Baseline: Goal status: IN PROGRESS 08/02/23  2.  Patient will be independent with initial HEP  Baseline:  Goal status: ongoing 7/9  LONG TERM GOALS: Target date: 09/06/2023  Patient to report pain no greater than 2/10  Baseline:  Goal status: INITIAL  2.  Patient to be independent with advanced HEP  Baseline:  Goal status: INITIAL  3.  Patient will be able to reach overhead into cabinets and on top of shelves without pain  Baseline:  Goal status: INITIAL  4.  Patient to be able to sleep through the night  Baseline:  Goal status: INITIAL  5.  Quick dash score to improve to 35% or better Baseline:  Goal status: INITIAL  6.  Patient to be able to return to work Baseline:  Goal status: INITIAL  PLAN: PT FREQUENCY: 2x/week  PT DURATION: 8 weeks  PLANNED INTERVENTIONS: 97110-Therapeutic exercises, 97530- Therapeutic activity, [id number]- Neuromuscular re-education, 97535- Self Care, [id number]- Manual therapy, [id number]- Aquatic Therapy, 97760- Splinting, [id number]- Electrical stimulation (unattended), [id number]- Electrical stimulation (manual), [id number]- Vasopneumatic device, [id number]- Ultrasound, [id number]- Ionotophoresis 4mg /ml Dexamethasone, Taping, Joint mobilization, Scar mobilization, Cryotherapy, and Moist heat  PLAN FOR NEXT SESSION: continue AA/ROM and A/ROM for OH flexion, scapular PNF/GH joint mobs with movement, pulleys, finger ladder to progress AA/ROM, continue isometrics, PROM, wrist, hand and forearm strengthening.     Orvil Fester, PT 08/16/23 5:26 PM   [facility] [address] [place], [place]  [address] Phone # [phone] Fax [phone]

## 2023-08-17 ENCOUNTER — Ambulatory Visit

## 2023-08-23 ENCOUNTER — Encounter: Payer: Self-pay | Admitting: Physical Therapy

## 2023-08-23 ENCOUNTER — Ambulatory Visit: Admitting: Physical Therapy

## 2023-08-23 DIAGNOSIS — M25512 Pain in left shoulder: Secondary | ICD-10-CM | POA: Diagnosis not present

## 2023-08-23 DIAGNOSIS — M25612 Stiffness of left shoulder, not elsewhere classified: Secondary | ICD-10-CM

## 2023-08-23 DIAGNOSIS — M6281 Muscle weakness (generalized): Secondary | ICD-10-CM

## 2023-08-23 DIAGNOSIS — R252 Cramp and spasm: Secondary | ICD-10-CM

## 2023-08-23 NOTE — Therapy (Signed)
 OUTPATIENT PHYSICAL THERAPY UPPER EXTREMITY EVALUATION   Patient Name: Angel Kemp MRN: [id number] DOB:1970/05/07, 53 y.o., female Today's Date: 08/23/2023  END OF SESSION:  PT End of Session - 08/23/23 1619     Visit Number 5    Date for PT Re-Evaluation 09/06/23    Authorization Type [employer]    Progress Note Due on Visit 10    PT Start Time 1617    PT Stop Time 1655    PT Time Calculation (min) 38 min    Activity Tolerance Patient tolerated treatment well    Behavior During Therapy [facility] for tasks assessed/performed            Past Medical History:  Diagnosis Date   Allergy    Arthritis    Cough with hemoptysis 09/21/2011   Goiter    History of urinary urgency    Hypertension    Hyperthyroidism    Mass in chest 09/21/2011   Past Surgical History:  Procedure Laterality Date   TUBAL LIGATION     VIDEO BRONCHOSCOPY  09/23/2011   Procedure: VIDEO BRONCHOSCOPY WITHOUT FLUORO;  Surgeon: Florian Hint, MD;  Location: [facility] ENDOSCOPY;  Service: Cardiopulmonary;  Laterality: Bilateral;   WISDOM TOOTH EXTRACTION     Patient Active Problem List   Diagnosis Date Noted   Tinnitus of both ears 05/05/2023   Seasonal allergic rhinitis due to pollen 05/05/2023   Adjustment disorder with mixed anxiety and depressed mood 05/02/2023   Bereavement 04/12/2023   Mass of cervix 02/15/2023   Bilateral shoulder region arthritis 11/18/2022   Hidradenitis axillaris 06/24/2020   Chronic midline low back pain without sciatica 04/12/2017   Multiple thyroid  nodules 10/27/2016   Graves disease 10/27/2016   Urge incontinence 06/02/2016   Chest pain 07/23/2012   Hemoptysis 09/21/2011   Mediastinal mass 09/21/2011    PCP: Dr. Elia Simpler  REFERRING PROVIDER: Dr. Genelle  REFERRING DIAG: [phone] (ICD-10-CM) - Complete rotator cuff tear or rupture of left shoulder, not specified as traumatic  THERAPY DIAG:  Acute pain of left shoulder  Stiffness of left shoulder, not  elsewhere classified  Muscle weakness (generalized)  Cramp and spasm  Rationale for Evaluation and Treatment: Rehabilitation  ONSET DATE: 05/11/2023  SUBJECTIVE:                                                                                                                                                                                      SUBJECTIVE STATEMENT: I think I will need to be done after today due to outside factors.  I have been so busy with the need to pack and move so I haven't been able to do the  exercises.   Hand dominance: Right  PERTINENT HISTORY: Per patient, surgeon initially suspected she would need full repair but during surgery, found that this was not the case and ended up having just debridement and SAD.    PAIN:  08/02/23 Are you having pain? Yes: NPRS scale: 6-7/10  Pain location: left shoulder Pain description: sharp if I try to reach up, aching  Aggravating factors: reaching , dressing , bathing Relieving factors: ice, tylenol  and pain medication  PRECAUTIONS: None  RED FLAGS: None   WEIGHT BEARING RESTRICTIONS: No  FALLS:  Has patient fallen in last 6 months? 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  DIAGNOSTIC FINDINGS:  na  PATIENT SURVEYS :  Quick Dash  Quick Dash 70.5/100= 70.5%  COGNITION: Overall cognitive status: Within functional limits for tasks  assessed     SENSATION: WFL  POSTURE: Slightly rounded shoulder   UPPER EXTREMITY ROM:   Passive ROM Right eval Left eval Left 08/23/23  Shoulder flexion  100 130  Shoulder extension     Shoulder abduction  80 105  Shoulder adduction     Shoulder internal rotation  60 80  Shoulder external rotation  8 65  Elbow flexion     Elbow extension     Wrist flexion     Wrist extension     Wrist ulnar deviation     Wrist radial deviation     Wrist pronation     Wrist supination     (Blank rows = not tested)  UPPER EXTREMITY MMT:  MMT Right eval Left eval Left 08/22/23  Shoulder flexion 4 3 4   Shoulder extension     Shoulder abduction 4 3 4   Shoulder adduction     Shoulder internal rotation 4+ 3 3+  Shoulder external rotation 4 3 4-  Middle trapezius     Lower trapezius     Elbow flexion     Elbow extension     Wrist flexion     Wrist extension     Wrist ulnar deviation     Wrist radial deviation     Wrist pronation     Wrist supination     Grip strength (lbs)     (Blank rows = not tested)   JOINT MOBILITY TESTING:  deferred  PALPATION:  Tender at anterior and superior shoulder     TREATMENT DATE:    08/23/23 ROM, strength assessment, Quick Dash Updated HEP and reviewed 5-10 reps of each: Wall slides flexion, scaption, abduction Finger walk up wall to full elevation, then step back and lower with control A/ROM Lt shoulder flexion Standing green tband row Lower trap lift offs from wall  08/16/23 Seated ranger at 90 deg x2' Lt shoulder SL Lt scapular PNF for retraction/depression, then AA/ROM for Lt UE flexion in Rt SL Supine dowel chest press and overhead flexion with PT providing inferior glide at [facility] joint x10 Supine dowel scaption Lt with PT providing inferior glide at [facility] joint x 10 Finger ladder with PT  TC at scapula for depression with overhead position x5 Standing bicep curls bil 2lb x 20 Lt UE A/ROM flexion reach and tap shoulder height  shelf x6, overhead shelf x6 with cues for focused scapular mechanics - much improved Standing ranger overhead range to 120 deg x10 Lt UE 2lb ankle weight rolled up, push up wall x 6 Standing A/ROM Lt shoulder - Lt flexion x3 reps - to 115 deg with improved mechanics  08/03/23  Lt shoulder Supine P/ROM and AA/ROM: flexion, IR/ER, scaption Lt shoulder Isometric holds at 80, 90, 100 deg flexion x10 each Lt Arm at side supine with pressure by PT: IR/ER isometrics 5x5 each Lt hand digiflex 5lb x1' Supine bicep curls bil 2lb (tried 3lb but too hard) x12 Supine dowel chest press at 80 deg and 90 deg x10 each Pulleys flexion x2' Finger ladder x 3 rounds - reached 32 Seated ranger x1' with hip hinge reach    PATIENT EDUCATION: Education details: Using models, educated patient on anatomy of the shoulder, what impingement is and how we will proceed with her rehab, also went over precautions and contraindications along with pain control Person educated: Patient Education method: Explanation, Demonstration, Verbal cues, and Handouts Education comprehension: verbalized understanding, returned demonstration, and verbal cues required  HOME EXERCISE PROGRAM: Access Code: [email or web address] URL: [email or web address] Date: 08/23/2023 Prepared by: Orvil Cierah Crader  Exercises - Supine Shoulder Flexion Extension AAROM with Dowel  - 1 x daily - 7 x weekly - 1 sets - 10 reps - Supine Shoulder Abduction AAROM with Dowel  - 1 x daily - 7 x weekly - 1 sets - 10 reps - Supine Shoulder External Rotation in 45 Degrees Abduction AAROM with Dowel  - 1 x daily - 7 x weekly - 1 sets - 10 reps - Standing Shoulder Internal Rotation Stretch with Towel  - 1 x daily - 7 x weekly - 1 sets - 10 reps - Circular Shoulder Pendulum with Table Support  - 1 x daily - 7 x weekly - 3 sets - 10 reps - Standing Isometric Shoulder Flexion with Doorway - Arm Bent  - 1 x daily - 7 x weekly - 1 sets - 5 reps - 5 sec hold -  Standing Isometric Shoulder Extension with Doorway - Arm Bent  - 1 x daily - 7 x weekly - 1 sets - 5 reps - 5 sec hold - Standing Isometric Shoulder Abduction with Doorway - Arm Bent  - 1 x daily - 7 x weekly - 1 sets - 5 reps - 5 sec hold - Standing Isometric Shoulder External Rotation with Doorway and Towel Roll  - 1 x daily - 7 x weekly - 1 sets - 5 reps - 5 sec hold - Standing Isometric Shoulder Internal Rotation with Towel Roll at Doorway  - 1 x daily - 7 x weekly - 1 sets - 5 reps - 5 sec hold - Shoulder Flexion Wall Slide with Towel  - 1 x daily - 7 x weekly - 1 sets - 10 reps - Scaption Wall Slide with Towel  - 1 x daily - 7 x weekly - 1 sets - 10 reps - Standing Shoulder Abduction Slides at Wall  - 1 x daily - 7 x weekly - 1 sets - 10 reps - Standing Shoulder Flexion Wall Walk  - 1 x daily - 7 x weekly - 1 sets - 10 reps - Standing Shoulder Flexion Full Range Single Arm  - 1 x daily - 7  x weekly - 2 sets - 5 reps - Standing Shoulder Row with Anchored Resistance  - 1 x daily - 7 x weekly - 3 sets - 10 reps - Low Trap Setting at Wall  - 1 x daily - 7 x weekly - 1 sets - 10 reps  ASSESSMENT:  CLINICAL IMPRESSION: Wilmoth reported today needed to be her last appointment secondary to challenges with transportation and prepping to move.  She has been very stressed with packing and cleaning.  She demonstrates much improving ROM, reaching 130 deg of flexion A/ROM of Lt shoulder today.  Passively she reaches 160 deg so we discussed there is room for more motion as her strength improves.  She continues to have pain with use, especially overhead, but her pain has reduced to a 6/10 from 9-10/10.  PT updated her HEP today and encouraged her to be compliant to stay on track with making continued gains.  She may return with a new PT Rx if she is able.   OBJECTIVE IMPAIRMENTS: decreased ROM, decreased strength, increased fascial restrictions, increased muscle spasms, impaired UE functional use, postural  dysfunction, and pain.   ACTIVITY LIMITATIONS: carrying, lifting, sleeping, transfers, bed mobility, bathing, dressing, reach over head, hygiene/grooming, and caring for others  PARTICIPATION LIMITATIONS: meal prep, cleaning, laundry, driving, shopping, community activity, occupation, yard work, and church  PERSONAL FACTORS: 1 comorbidity: htn are also affecting patient's functional outcome.   REHAB POTENTIAL: Good  CLINICAL DECISION MAKING: Stable/uncomplicated  EVALUATION COMPLEXITY: Low  GOALS: Goals reviewed with patient? Yes  SHORT TERM GOALS: Target date: 08/09/2023  Pain report to be no greater than 4/10  Baseline: Goal status: PARTIALLY MET 7/16, 6/10  2.  Patient will be independent with initial HEP  Baseline:  Goal status: MET 7/16  LONG TERM GOALS: Target date: 09/06/2023  Patient to report pain no greater than 2/10  Baseline:  Goal status: NOT MET 7/16, PT NEEDED TO D/C PT EARLY  2.  Patient to be independent with advanced HEP  Baseline:  Goal status: PARTIALLY MET 7/16, PT NEEDED TO D/C PT EARLY  3.  Patient will be able to reach overhead into cabinets and on top of shelves without pain  Baseline:  Goal status: ACHIEVED FOR REACHING BUT WITH PAIN 08/23/23  4.  Patient to be able to sleep through the night  Baseline:  Goal status: PARTIALLY MET 7/16  5.  Quick dash score to improve to 35% or better Baseline:  Goal status: 38.6%, MET 7/16  6.  Patient to be able to return to work Baseline:  Goal status: NOT MET   PLAN: PT FREQUENCY: 2x/week  PT DURATION: 8 weeks  PLANNED INTERVENTIONS: 97110-Therapeutic exercises, 97530- Therapeutic activity, [id number]- Neuromuscular re-education, 97535- Self Care, [id number]- Manual therapy, [id number]- Aquatic Therapy, 97760- Splinting, H9716- Electrical stimulation (unattended), [id number]- Electrical stimulation (manual), 97016- Vasopneumatic device, [id number]- Ultrasound, [id number]- Ionotophoresis 4mg /ml Dexamethasone, Taping, Joint  mobilization, Scar mobilization, Cryotherapy, and Moist heat  PLAN FOR NEXT SESSION: Pt needed to d/c PT due to a need to pack/move and lack of transportation   PHYSICAL THERAPY DISCHARGE SUMMARY  Visits from Start of Care: 5  Current functional level related to goals / functional outcomes: Pt making good progress with ROM.  She is able to reach to 130 deg.  Her pain can reach 6/10 with use.  She has not yet returned to work.  She requested to d/c PT today secondary to needing to pack/move and due to lack of transportation.  Remaining deficits: Overhead flexion, see above   Education / Equipment: HEP   Patient agrees to discharge. Patient goals were partially met. Patient is being discharged due to need to move, lack of transportation.   Orvil Fester, PT 08/23/23 5:19 PM   [facility] [address], [address] [place], [place] [address] Phone # [phone] Fax [phone]

## 2023-08-24 ENCOUNTER — Ambulatory Visit

## 2023-08-30 ENCOUNTER — Encounter

## 2023-08-31 ENCOUNTER — Encounter

## 2023-09-06 ENCOUNTER — Encounter

## 2023-09-06 ENCOUNTER — Ambulatory Visit (HOSPITAL_BASED_OUTPATIENT_CLINIC_OR_DEPARTMENT_OTHER): Admitting: Orthopaedic Surgery

## 2023-09-06 DIAGNOSIS — M75122 Complete rotator cuff tear or rupture of left shoulder, not specified as traumatic: Secondary | ICD-10-CM

## 2023-09-06 NOTE — Progress Notes (Signed)
 Post Operative Evaluation    Procedure/Date of Surgery: Left shoulder debridement 5/15  Interval History:    Presents 10 weeks status post the above procedure.  Unfortunately as she has recently had some difficulty with her house and as result has not been able to work on the range of motion is much as she would like.  That being said she is still making significant improvement.  Scheduled to return to work on the seventh   PMH/PSH/Family History/Social History/Meds/Allergies:    Past Medical History:  Diagnosis Date   Allergy    Arthritis    Cough with hemoptysis 09/21/2011   Goiter    History of urinary urgency    Hypertension    Hyperthyroidism    Mass in chest 09/21/2011   Past Surgical History:  Procedure Laterality Date   TUBAL LIGATION     VIDEO BRONCHOSCOPY  09/23/2011   Procedure: VIDEO BRONCHOSCOPY WITHOUT FLUORO;  Surgeon: Florian Hint, MD;  Location: [facility] ENDOSCOPY;  Service: Cardiopulmonary;  Laterality: Bilateral;   WISDOM TOOTH EXTRACTION     Social History   Socioeconomic History   Marital status: Single    Spouse name: Not on file   Number of children: Not on file   Years of education: Not on file   Highest education level: Associate degree: occupational, Scientist, product/process development, or vocational program  Occupational History   Not on file  Tobacco Use   Smoking status: Former    Current packs/day: 0.00    Types: Cigarettes    Quit date: 07/07/2011    Years since quitting: 12.1   Smokeless tobacco: Never  Vaping Use   Vaping status: Never Used  Substance and Sexual Activity   Alcohol use: No   Drug use: No   Sexual activity: Not Currently    Birth control/protection: Post-menopausal, Surgical  Other Topics Concern   Not on file  Social History Narrative   Not on file   Social Drivers of Health   Financial Resource Strain: Medium Risk (02/15/2023)   Overall Financial Resource Strain (CARDIA)    Difficulty of  Paying Living Expenses: Somewhat hard  Food Insecurity: Food Insecurity Present (02/15/2023)   Hunger Vital Sign    Worried About Running Out of Food in the Last Year: Sometimes true    Ran Out of Food in the Last Year: Sometimes true  Transportation Needs: No Transportation Needs (02/15/2023)   PRAPARE - Administrator, Civil Service (Medical): No    Lack of Transportation (Non-Medical): No  Physical Activity: Inactive (02/15/2023)   Exercise Vital Sign    Days of Exercise per Week: 0 days    Minutes of Exercise per Session: 10 min  Stress: Stress Concern Present (02/15/2023)   [employer] of Occupational Health - Occupational Stress Questionnaire    Feeling of Stress : Very much  Social Connections: Unknown (02/15/2023)   Social Connection and Isolation Panel    Frequency of Communication with Friends and Family: Patient declined    Frequency of Social Gatherings with Friends and Family: Patient declined    Attends Religious Services: Never    Database administrator or Organizations: No    Attends Engineer, structural: Not on file    Marital Status: Never married  Recent Concern: Social Connections - Socially Isolated (12/05/2022)  Social Advertising account executive    Frequency of Communication with Friends and Family: More than three times a week    Frequency of Social Gatherings with Friends and Family: More than three times a week    Attends Religious Services: Never    Database administrator or Organizations: No    Attends Engineer, structural: Not on file    Marital Status: Never married   Family History  Problem Relation Age of Onset   Cancer Mother        Lung   Hypertension Mother    Diabetes type II Mother    Colon cancer Neg Hx    Rectal cancer Neg Hx    Stomach cancer Neg Hx    Esophageal cancer Neg Hx    Allergies  Allergen Reactions   Latex Itching and Rash   Current Outpatient Medications  Medication Sig Dispense Refill    acetaminophen  (TYLENOL ) 325 MG tablet Take 650 mg by mouth every 6 (six) hours as needed.     aspirin  EC 325 MG tablet Take 1 tablet (325 mg total) by mouth daily. 30 tablet 0   fluticasone  (FLONASE ) 50 MCG/ACT nasal spray Place 2 sprays into both nostrils daily. 16 g 3   oxyCODONE  (ROXICODONE ) 5 MG immediate release tablet Take 1 tablet (5 mg total) by mouth every 4 (four) hours as needed for severe pain (pain score 7-10) or breakthrough pain. 20 tablet 0   traMADol  (ULTRAM ) 50 MG tablet Take 1 tablet (50 mg total) by mouth every 6 (six) hours as needed. 15 tablet 0   zolpidem  (AMBIEN ) 5 MG tablet Take 1 tablet (5 mg total) by mouth at bedtime as needed for sleep. 15 tablet 1   No current facility-administered medications for this visit.   No results found.  Review of Systems:   A ROS was performed including pertinent positives and negatives as documented in the HPI.   Musculoskeletal Exam:    There were no vitals taken for this visit.  Left shoulder incisions are well-appearing without erythema or drainage.  In the spine position she can forward elevate to 90 degrees external rotation at 45 degrees.  Distal neurosensory exam is intact  Imaging:      I personally reviewed and interpreted the radiographs.   Assessment:   10-week status post left shoulder arthroscopic debridement overall doing well.  At this time she is continuing to improve slowly.  She will continue to work on a home program and I will plan to see her back in 12 weeks for reassessment Plan :    - Return to clinic in 12 weeks for reassessment      I personally saw and evaluated the patient, and participated in the management and treatment plan.  Elspeth Parker, MD Attending Physician, Orthopedic Surgery  This document was dictated using Dragon voice recognition software. A reasonable attempt at proof reading has been made to minimize errors.

## 2023-09-08 ENCOUNTER — Other Ambulatory Visit (HOSPITAL_BASED_OUTPATIENT_CLINIC_OR_DEPARTMENT_OTHER): Payer: Self-pay

## 2023-09-08 ENCOUNTER — Encounter (HOSPITAL_BASED_OUTPATIENT_CLINIC_OR_DEPARTMENT_OTHER): Payer: Self-pay | Admitting: Orthopaedic Surgery

## 2023-09-08 ENCOUNTER — Other Ambulatory Visit (HOSPITAL_BASED_OUTPATIENT_CLINIC_OR_DEPARTMENT_OTHER): Payer: Self-pay | Admitting: Orthopaedic Surgery

## 2023-09-08 MED ORDER — OXYCODONE HCL 5 MG PO TABS
5.0000 mg | ORAL_TABLET | ORAL | 0 refills | Status: AC | PRN
  Filled 2023-09-08: qty 20, 4d supply, fill #0

## 2023-09-28 ENCOUNTER — Encounter (HOSPITAL_BASED_OUTPATIENT_CLINIC_OR_DEPARTMENT_OTHER): Payer: Self-pay | Admitting: Orthopaedic Surgery

## 2023-10-17 NOTE — Telephone Encounter (Signed)
Form printed to complete.

## 2023-12-06 ENCOUNTER — Ambulatory Visit (HOSPITAL_BASED_OUTPATIENT_CLINIC_OR_DEPARTMENT_OTHER): Admitting: Orthopaedic Surgery

## 2023-12-06 ENCOUNTER — Other Ambulatory Visit (HOSPITAL_BASED_OUTPATIENT_CLINIC_OR_DEPARTMENT_OTHER): Payer: Self-pay

## 2023-12-06 DIAGNOSIS — M75122 Complete rotator cuff tear or rupture of left shoulder, not specified as traumatic: Secondary | ICD-10-CM | POA: Diagnosis not present

## 2023-12-06 NOTE — Progress Notes (Signed)
 Post Operative Evaluation    Procedure/Date of Surgery: Left shoulder debridement 5/15  Interval History:    Presents status post above procedure.  She is continuing to improve slowly although unfortunately her range of motion has decreased with limited overhead range of motion   PMH/PSH/Family History/Social History/Meds/Allergies:    Past Medical History:  Diagnosis Date   Allergy    Arthritis    Cough with hemoptysis 09/21/2011   Goiter    History of urinary urgency    Hypertension    Hyperthyroidism    Mass in chest 09/21/2011   Past Surgical History:  Procedure Laterality Date   TUBAL LIGATION     VIDEO BRONCHOSCOPY  09/23/2011   Procedure: VIDEO BRONCHOSCOPY WITHOUT FLUORO;  Surgeon: Florian Hint, MD;  Location: [facility] ENDOSCOPY;  Service: Cardiopulmonary;  Laterality: Bilateral;   WISDOM TOOTH EXTRACTION     Social History   Socioeconomic History   Marital status: Single    Spouse name: Not on file   Number of children: Not on file   Years of education: Not on file   Highest education level: Associate degree: occupational, scientist, product/process development, or vocational program  Occupational History   Not on file  Tobacco Use   Smoking status: Former    Current packs/day: 0.00    Types: Cigarettes    Quit date: 07/07/2011    Years since quitting: 12.4   Smokeless tobacco: Never  Vaping Use   Vaping status: Never Used  Substance and Sexual Activity   Alcohol use: No   Drug use: No   Sexual activity: Not Currently    Birth control/protection: Post-menopausal, Surgical  Other Topics Concern   Not on file  Social History Narrative   Not on file   Social Drivers of Health   Financial Resource Strain: Medium Risk (02/15/2023)   Overall Financial Resource Strain (CARDIA)    Difficulty of Paying Living Expenses: Somewhat hard  Food Insecurity: Food Insecurity Present (02/15/2023)   Hunger Vital Sign    Worried About Running Out of Food  in the Last Year: Sometimes true    Ran Out of Food in the Last Year: Sometimes true  Transportation Needs: No Transportation Needs (02/15/2023)   PRAPARE - Administrator, Civil Service (Medical): No    Lack of Transportation (Non-Medical): No  Physical Activity: Inactive (02/15/2023)   Exercise Vital Sign    Days of Exercise per Week: 0 days    Minutes of Exercise per Session: 10 min  Stress: Stress Concern Present (02/15/2023)   [employer] of Occupational Health - Occupational Stress Questionnaire    Feeling of Stress : Very much  Social Connections: Unknown (02/15/2023)   Social Connection and Isolation Panel    Frequency of Communication with Friends and Family: Patient declined    Frequency of Social Gatherings with Friends and Family: Patient declined    Attends Religious Services: Never    Database Administrator or Organizations: No    Attends Engineer, Structural: Not on file    Marital Status: Never married  Recent Concern: Social Connections - Socially Isolated (12/05/2022)   Social Connection and Isolation Panel    Frequency of Communication with Friends and Family: More than three times a week    Frequency of Social Gatherings with Friends and Family: More  than three times a week    Attends Religious Services: Never    Active Member of Clubs or Organizations: No    Attends Engineer, Structural: Not on file    Marital Status: Never married   Family History  Problem Relation Age of Onset   Cancer Mother        Lung   Hypertension Mother    Diabetes type II Mother    Colon cancer Neg Hx    Rectal cancer Neg Hx    Stomach cancer Neg Hx    Esophageal cancer Neg Hx    Allergies  Allergen Reactions   Latex Itching and Rash   Current Outpatient Medications  Medication Sig Dispense Refill   acetaminophen  (TYLENOL ) 325 MG tablet Take 650 mg by mouth every 6 (six) hours as needed.     aspirin  EC 325 MG tablet Take 1 tablet (325 mg total)  by mouth daily. 30 tablet 0   fluticasone  (FLONASE ) 50 MCG/ACT nasal spray Place 2 sprays into both nostrils daily. 16 g 3   oxyCODONE  (ROXICODONE ) 5 MG immediate release tablet Take 1 tablet (5 mg total) by mouth every 4 (four) hours as needed for severe pain (pain score 7-10) or breakthrough pain. 20 tablet 0   traMADol  (ULTRAM ) 50 MG tablet Take 1 tablet (50 mg total) by mouth every 6 (six) hours as needed. 15 tablet 0   zolpidem  (AMBIEN ) 5 MG tablet Take 1 tablet (5 mg total) by mouth at bedtime as needed for sleep. 15 tablet 1   No current facility-administered medications for this visit.   No results found.  Review of Systems:   A ROS was performed including pertinent positives and negatives as documented in the HPI.   Musculoskeletal Exam:    There were no vitals taken for this visit.  Left shoulder incisions are well-appearing without erythema or drainage.  In the spine position she can forward elevate to 90 degrees external rotation at 45 degrees.  Distal neurosensory exam is intact  Imaging:      I personally reviewed and interpreted the radiographs.   Assessment:   Status post left shoulder arthroscopic debridement overall doing well.  At this time she is continuing to improve slowly.  Left shoulder does show evidence of possible freezing and given that I have recommended ultrasound-guided injections today which she would like to pursue I will plan to see her back in 4 weeks at that time with possible return to work at that time Plan :    - Return to clinic in 4 weeks for reassessment      I personally saw and evaluated the patient, and participated in the management and treatment plan.  Elspeth Parker, MD Attending Physician, Orthopedic Surgery  This document was dictated using Dragon voice recognition software. A reasonable attempt at proof reading has been made to minimize errors.

## 2023-12-11 ENCOUNTER — Encounter: Payer: Self-pay | Admitting: Radiology

## 2023-12-11 ENCOUNTER — Other Ambulatory Visit (HOSPITAL_BASED_OUTPATIENT_CLINIC_OR_DEPARTMENT_OTHER): Payer: Self-pay | Admitting: Orthopaedic Surgery

## 2023-12-21 ENCOUNTER — Other Ambulatory Visit: Payer: Self-pay | Admitting: Family Medicine

## 2023-12-21 DIAGNOSIS — Z1231 Encounter for screening mammogram for malignant neoplasm of breast: Secondary | ICD-10-CM

## 2023-12-25 ENCOUNTER — Inpatient Hospital Stay: Admission: RE | Admit: 2023-12-25 | Payer: Managed Care, Other (non HMO) | Source: Ambulatory Visit

## 2024-01-03 ENCOUNTER — Ambulatory Visit (HOSPITAL_BASED_OUTPATIENT_CLINIC_OR_DEPARTMENT_OTHER): Admitting: Orthopaedic Surgery

## 2024-01-03 DIAGNOSIS — M75122 Complete rotator cuff tear or rupture of left shoulder, not specified as traumatic: Secondary | ICD-10-CM

## 2024-01-03 NOTE — Progress Notes (Signed)
 Post Operative Evaluation    Procedure/Date of Surgery: Left shoulder debridement 5/15  Interval History:    Presents status post above procedure.  At this time overhead range of motion has dramatically improved.  She is feeling overall quite well   PMH/PSH/Family History/Social History/Meds/Allergies:    Past Medical History:  Diagnosis Date   Allergy    Arthritis    Cough with hemoptysis 09/21/2011   Goiter    History of urinary urgency    Hypertension    Hyperthyroidism    Mass in chest 09/21/2011   Past Surgical History:  Procedure Laterality Date   TUBAL LIGATION     VIDEO BRONCHOSCOPY  09/23/2011   Procedure: VIDEO BRONCHOSCOPY WITHOUT FLUORO;  Surgeon: Florian Hint, MD;  Location: [facility] END[facility];  Service: Cardiopulmonary;  Laterality: Bilateral;   WISDOM TOOTH EXTRACTION     Social History   Socioeconomic History   Marital status: Single    Spouse name: Not on file   Number of children: Not on file   Years of education: Not on file   Highest education level: Associate degree: occupational, scientist, product/process development, or vocational program  Occupational History   Not on file  Tobacco Use   Smoking status: Former    Current packs/day: 0.00    Types: Cigarettes    Quit date: 07/07/2011    Years since quitting: 12.5   Smokeless tobacco: Never  Vaping Use   Vaping status: Never Used  Substance and Sexual Activity   Alcohol use: No   Drug use: No   Sexual activity: Not Currently    Birth control/protection: Post-menopausal, Surgical  Other Topics Concern   Not on file  Social History Narrative   Not on file   Social Drivers of Health   Financial Resource Strain: Medium Risk (02/15/2023)   Overall Financial Resource Strain (CARDIA)    Difficulty of Paying Living Expenses: Somewhat hard  Food Insecurity: Food Insecurity Present (02/15/2023)   Hunger Vital Sign    Worried About Running Out of Food in the Last Year: Sometimes  true    Ran Out of Food in the Last Year: Sometimes true  Transportation Needs: No Transportation Needs (02/15/2023)   PRAPARE - Administrator, Civil Service (Medical): No    Lack of Transportation (Non-Medical): No  Physical Activity: Inactive (02/15/2023)   Exercise Vital Sign    Days of Exercise per Week: 0 days    Minutes of Exercise per Session: 10 min  Stress: Stress Concern Present (02/15/2023)   [employer] of Occupational Health - Occupational Stress Questionnaire    Feeling of Stress : Very much  Social Connections: Unknown (02/15/2023)   Social Connection and Isolation Panel    Frequency of Communication with Friends and Family: Patient declined    Frequency of Social Gatherings with Friends and Family: Patient declined    Attends Religious Services: Never    Database Administrator or Organizations: No    Attends Engineer, Structural: Not on file    Marital Status: Never married  Recent Concern: Social Connections - Socially Isolated (12/05/2022)   Social Connection and Isolation Panel    Frequency of Communication with Friends and Family: More than three times a week    Frequency of Social Gatherings with Friends and Family: More than three times  a week    Attends Religious Services: Never    Active Member of Clubs or Organizations: No    Attends Engineer, Structural: Not on file    Marital Status: Never married   Family History  Problem Relation Age of Onset   Cancer Mother        Lung   Hypertension Mother    Diabetes type II Mother    Colon cancer Neg Hx    Rectal cancer Neg Hx    Stomach cancer Neg Hx    Esophageal cancer Neg Hx    Allergies  Allergen Reactions   Latex Itching and Rash   Current Outpatient Medications  Medication Sig Dispense Refill   acetaminophen  (TYLENOL ) 325 MG tablet Take 650 mg by mouth every 6 (six) hours as needed.     aspirin  EC 325 MG tablet Take 1 tablet (325 mg total) by mouth daily. 30 tablet 0    fluticasone  (FLONASE ) 50 MCG/ACT nasal spray Place 2 sprays into both nostrils daily. 16 g 3   oxyCODONE  (ROXICODONE ) 5 MG immediate release tablet Take 1 tablet (5 mg total) by mouth every 4 (four) hours as needed for severe pain (pain score 7-10) or breakthrough pain. 20 tablet 0   traMADol  (ULTRAM ) 50 MG tablet Take 1 tablet (50 mg total) by mouth every 6 (six) hours as needed. 15 tablet 0   zolpidem  (AMBIEN ) 5 MG tablet Take 1 tablet (5 mg total) by mouth at bedtime as needed for sleep. 15 tablet 1   No current facility-administered medications for this visit.   No results found.  Review of Systems:   A ROS was performed including pertinent positives and negatives as documented in the HPI.   Musculoskeletal Exam:    There were no vitals taken for this visit.  Left shoulder incisions are well-appearing without erythema or drainage.  In the spine position she can forward elevate to 90 degrees external rotation at 45 degrees.  Distal neurosensory exam is intact  Imaging:      I personally reviewed and interpreted the radiographs.   Assessment:   Status post left shoulder arthroscopic debridement overall doing well.  I will plan to see her back as needed return to work note provided Plan :    - Return to clinic as needed      I personally saw and evaluated the patient, and participated in the management and treatment plan.  Elspeth Parker, MD Attending Physician, Orthopedic Surgery  This document was dictated using Dragon voice recognition software. A reasonable attempt at proof reading has been made to minimize errors.

## 2024-01-05 ENCOUNTER — Other Ambulatory Visit (HOSPITAL_BASED_OUTPATIENT_CLINIC_OR_DEPARTMENT_OTHER): Payer: Self-pay

## 2024-01-05 ENCOUNTER — Encounter (HOSPITAL_BASED_OUTPATIENT_CLINIC_OR_DEPARTMENT_OTHER): Payer: Self-pay

## 2024-01-22 ENCOUNTER — Encounter
